# Patient Record
Sex: Male | Born: 1940 | Race: Black or African American | Hispanic: No | Marital: Single | Smoking: Former smoker
Health system: Southern US, Community
[De-identification: ages and names within clinical notes are randomized; demographics above are authoritative.]

## PROBLEM LIST (undated history)

## (undated) DIAGNOSIS — E559 Vitamin D deficiency, unspecified: Secondary | ICD-10-CM

## (undated) DIAGNOSIS — K219 Gastro-esophageal reflux disease without esophagitis: Secondary | ICD-10-CM

## (undated) DIAGNOSIS — M109 Gout, unspecified: Secondary | ICD-10-CM

## (undated) DIAGNOSIS — M25511 Pain in right shoulder: Secondary | ICD-10-CM

## (undated) DIAGNOSIS — I10 Essential (primary) hypertension: Secondary | ICD-10-CM

## (undated) DIAGNOSIS — M25512 Pain in left shoulder: Secondary | ICD-10-CM

## (undated) DIAGNOSIS — D509 Iron deficiency anemia, unspecified: Secondary | ICD-10-CM

## (undated) DIAGNOSIS — K429 Umbilical hernia without obstruction or gangrene: Secondary | ICD-10-CM

## (undated) DIAGNOSIS — I4891 Unspecified atrial fibrillation: Secondary | ICD-10-CM

## (undated) DIAGNOSIS — E119 Type 2 diabetes mellitus without complications: Secondary | ICD-10-CM

## (undated) DIAGNOSIS — E785 Hyperlipidemia, unspecified: Secondary | ICD-10-CM

## (undated) DIAGNOSIS — N189 Chronic kidney disease, unspecified: Secondary | ICD-10-CM

## (undated) DIAGNOSIS — M1990 Unspecified osteoarthritis, unspecified site: Secondary | ICD-10-CM

## (undated) DIAGNOSIS — K449 Diaphragmatic hernia without obstruction or gangrene: Secondary | ICD-10-CM

## (undated) HISTORY — PX: UMBILICAL HERNIA REPAIR: SHX196

## (undated) HISTORY — DX: Diaphragmatic hernia without obstruction or gangrene: K44.9

## (undated) HISTORY — DX: Unspecified osteoarthritis, unspecified site: M19.90

## (undated) HISTORY — DX: Chronic kidney disease, unspecified: N18.9

## (undated) HISTORY — DX: Essential (primary) hypertension: I10

## (undated) HISTORY — DX: Iron deficiency anemia, unspecified: D50.9

## (undated) HISTORY — DX: Hyperlipidemia, unspecified: E78.5

## (undated) HISTORY — DX: Type 2 diabetes mellitus without complications: E11.9

## (undated) HISTORY — DX: Vitamin D deficiency, unspecified: E55.9

## (undated) HISTORY — DX: Pain in left shoulder: M25.512

## (undated) HISTORY — DX: Gout, unspecified: M10.9

## (undated) HISTORY — DX: Pain in right shoulder: M25.511

## (undated) HISTORY — PX: CARPAL TUNNEL RELEASE: SHX101

## (undated) HISTORY — DX: Unspecified atrial fibrillation: I48.91

---

## 1993-02-14 HISTORY — PX: KNEE ARTHROSCOPY WITH PATELLA RECONSTRUCTION: SHX5654

## 1998-02-14 HISTORY — PX: UMBILICAL HERNIA REPAIR: SHX196

## 2006-04-19 DIAGNOSIS — M19079 Primary osteoarthritis, unspecified ankle and foot: Secondary | ICD-10-CM

## 2006-04-19 HISTORY — DX: Primary osteoarthritis, unspecified ankle and foot: M19.079

## 2008-02-15 HISTORY — PX: CARPAL TUNNEL RELEASE: SHX101

## 2012-08-13 DIAGNOSIS — G609 Hereditary and idiopathic neuropathy, unspecified: Secondary | ICD-10-CM

## 2012-08-13 DIAGNOSIS — I10 Essential (primary) hypertension: Secondary | ICD-10-CM | POA: Insufficient documentation

## 2012-08-13 DIAGNOSIS — H269 Unspecified cataract: Secondary | ICD-10-CM

## 2012-08-13 HISTORY — DX: Unspecified cataract: H26.9

## 2012-08-13 HISTORY — DX: Hereditary and idiopathic neuropathy, unspecified: G60.9

## 2012-08-13 HISTORY — DX: Essential (primary) hypertension: I10

## 2012-11-14 DIAGNOSIS — G5600 Carpal tunnel syndrome, unspecified upper limb: Secondary | ICD-10-CM

## 2012-11-14 HISTORY — DX: Carpal tunnel syndrome, unspecified upper limb: G56.00

## 2013-11-15 DIAGNOSIS — N172 Acute kidney failure with medullary necrosis: Secondary | ICD-10-CM

## 2013-11-15 HISTORY — DX: Acute kidney failure with medullary necrosis: N17.2

## 2013-12-19 DIAGNOSIS — R0602 Shortness of breath: Secondary | ICD-10-CM | POA: Insufficient documentation

## 2013-12-19 HISTORY — DX: Shortness of breath: R06.02

## 2014-03-12 DIAGNOSIS — L219 Seborrheic dermatitis, unspecified: Secondary | ICD-10-CM | POA: Diagnosis not present

## 2014-03-12 DIAGNOSIS — L281 Prurigo nodularis: Secondary | ICD-10-CM | POA: Diagnosis not present

## 2014-03-12 DIAGNOSIS — D234 Other benign neoplasm of skin of scalp and neck: Secondary | ICD-10-CM | POA: Diagnosis not present

## 2014-03-20 DIAGNOSIS — E785 Hyperlipidemia, unspecified: Secondary | ICD-10-CM | POA: Diagnosis not present

## 2014-03-20 DIAGNOSIS — E119 Type 2 diabetes mellitus without complications: Secondary | ICD-10-CM | POA: Diagnosis not present

## 2014-03-20 DIAGNOSIS — I10 Essential (primary) hypertension: Secondary | ICD-10-CM | POA: Diagnosis not present

## 2014-03-20 DIAGNOSIS — I480 Paroxysmal atrial fibrillation: Secondary | ICD-10-CM | POA: Diagnosis not present

## 2014-03-27 DIAGNOSIS — M1611 Unilateral primary osteoarthritis, right hip: Secondary | ICD-10-CM | POA: Diagnosis not present

## 2014-04-03 DIAGNOSIS — R350 Frequency of micturition: Secondary | ICD-10-CM | POA: Diagnosis not present

## 2014-04-17 DIAGNOSIS — M25561 Pain in right knee: Secondary | ICD-10-CM | POA: Diagnosis not present

## 2014-05-07 DIAGNOSIS — D234 Other benign neoplasm of skin of scalp and neck: Secondary | ICD-10-CM | POA: Diagnosis not present

## 2014-05-07 DIAGNOSIS — L219 Seborrheic dermatitis, unspecified: Secondary | ICD-10-CM | POA: Diagnosis not present

## 2014-05-07 DIAGNOSIS — L910 Hypertrophic scar: Secondary | ICD-10-CM | POA: Diagnosis not present

## 2014-06-16 DIAGNOSIS — R100 Acute abdomen: Secondary | ICD-10-CM | POA: Diagnosis not present

## 2014-06-24 DIAGNOSIS — I10 Essential (primary) hypertension: Secondary | ICD-10-CM | POA: Diagnosis not present

## 2014-06-24 DIAGNOSIS — I480 Paroxysmal atrial fibrillation: Secondary | ICD-10-CM | POA: Diagnosis not present

## 2014-06-25 DIAGNOSIS — I480 Paroxysmal atrial fibrillation: Secondary | ICD-10-CM | POA: Diagnosis not present

## 2014-06-25 DIAGNOSIS — I10 Essential (primary) hypertension: Secondary | ICD-10-CM | POA: Diagnosis not present

## 2014-06-25 DIAGNOSIS — E119 Type 2 diabetes mellitus without complications: Secondary | ICD-10-CM | POA: Diagnosis not present

## 2014-06-25 DIAGNOSIS — E785 Hyperlipidemia, unspecified: Secondary | ICD-10-CM | POA: Diagnosis not present

## 2014-06-26 DIAGNOSIS — E119 Type 2 diabetes mellitus without complications: Secondary | ICD-10-CM | POA: Diagnosis not present

## 2014-06-26 DIAGNOSIS — E785 Hyperlipidemia, unspecified: Secondary | ICD-10-CM | POA: Diagnosis not present

## 2014-06-26 DIAGNOSIS — I480 Paroxysmal atrial fibrillation: Secondary | ICD-10-CM | POA: Diagnosis not present

## 2014-06-26 DIAGNOSIS — I10 Essential (primary) hypertension: Secondary | ICD-10-CM | POA: Diagnosis not present

## 2014-07-02 DIAGNOSIS — L910 Hypertrophic scar: Secondary | ICD-10-CM | POA: Diagnosis not present

## 2014-07-02 DIAGNOSIS — L219 Seborrheic dermatitis, unspecified: Secondary | ICD-10-CM | POA: Diagnosis not present

## 2014-07-02 DIAGNOSIS — D234 Other benign neoplasm of skin of scalp and neck: Secondary | ICD-10-CM | POA: Diagnosis not present

## 2014-07-04 DIAGNOSIS — R350 Frequency of micturition: Secondary | ICD-10-CM | POA: Diagnosis not present

## 2014-07-04 DIAGNOSIS — N2889 Other specified disorders of kidney and ureter: Secondary | ICD-10-CM | POA: Diagnosis not present

## 2014-07-31 DIAGNOSIS — G5600 Carpal tunnel syndrome, unspecified upper limb: Secondary | ICD-10-CM | POA: Diagnosis not present

## 2014-08-06 DIAGNOSIS — M1990 Unspecified osteoarthritis, unspecified site: Secondary | ICD-10-CM | POA: Diagnosis not present

## 2014-08-06 DIAGNOSIS — I739 Peripheral vascular disease, unspecified: Secondary | ICD-10-CM | POA: Diagnosis not present

## 2014-08-06 DIAGNOSIS — M25579 Pain in unspecified ankle and joints of unspecified foot: Secondary | ICD-10-CM | POA: Diagnosis not present

## 2014-08-06 DIAGNOSIS — I70203 Unspecified atherosclerosis of native arteries of extremities, bilateral legs: Secondary | ICD-10-CM | POA: Diagnosis not present

## 2014-08-19 DIAGNOSIS — G5692 Unspecified mononeuropathy of left upper limb: Secondary | ICD-10-CM | POA: Diagnosis not present

## 2014-09-19 DIAGNOSIS — E785 Hyperlipidemia, unspecified: Secondary | ICD-10-CM | POA: Diagnosis not present

## 2014-09-19 DIAGNOSIS — E119 Type 2 diabetes mellitus without complications: Secondary | ICD-10-CM | POA: Diagnosis not present

## 2014-09-19 DIAGNOSIS — I10 Essential (primary) hypertension: Secondary | ICD-10-CM | POA: Diagnosis not present

## 2014-11-20 ENCOUNTER — Ambulatory Visit (INDEPENDENT_AMBULATORY_CARE_PROVIDER_SITE_OTHER): Payer: Medicare Other | Admitting: Podiatry

## 2014-11-20 ENCOUNTER — Encounter: Payer: Self-pay | Admitting: Podiatry

## 2014-11-20 VITALS — BP 147/94 | HR 53 | Ht 71.0 in | Wt 235.0 lb

## 2014-11-20 DIAGNOSIS — M19079 Primary osteoarthritis, unspecified ankle and foot: Secondary | ICD-10-CM | POA: Insufficient documentation

## 2014-11-20 DIAGNOSIS — L600 Ingrowing nail: Secondary | ICD-10-CM | POA: Insufficient documentation

## 2014-11-20 DIAGNOSIS — M79601 Pain in right arm: Secondary | ICD-10-CM

## 2014-11-20 DIAGNOSIS — M129 Arthropathy, unspecified: Secondary | ICD-10-CM | POA: Diagnosis not present

## 2014-11-20 DIAGNOSIS — M79604 Pain in right leg: Secondary | ICD-10-CM

## 2014-11-20 DIAGNOSIS — M79606 Pain in leg, unspecified: Secondary | ICD-10-CM

## 2014-11-20 DIAGNOSIS — B351 Tinea unguium: Secondary | ICD-10-CM | POA: Diagnosis not present

## 2014-11-20 DIAGNOSIS — M79605 Pain in left leg: Secondary | ICD-10-CM

## 2014-11-20 HISTORY — DX: Ingrowing nail: L60.0

## 2014-11-20 HISTORY — DX: Primary osteoarthritis, unspecified ankle and foot: M19.079

## 2014-11-20 HISTORY — DX: Tinea unguium: B35.1

## 2014-11-20 HISTORY — DX: Pain in right leg: M79.604

## 2014-11-20 HISTORY — DX: Pain in right leg: M79.605

## 2014-11-20 NOTE — Progress Notes (Signed)
SUBJECTIVE: 74 y.o. year old male presents for diabetic foot care.  Diabetic x 30 years.  Blood sugar is under control. 120 last time checked.  Patient is ambulatory without assistance.  Patient goes to [place] and been checked by MD while in [place] 3-4 months ago.  Done with moving 2 weeks ago.   REVIEW OF SYSTEMS: A comprehensive review of systems was negative except for: Diabetes and hypertension.   OBJECTIVE: DERMATOLOGIC EXAMINATION: Nails: Painful ingrown nail on both great toes at medial border.  Thick hypertrophic nails x 10.  Skin Integrity: Normal without abnormal skin lesions.  VASCULAR EXAMINATION OF LOWER LIMBS: Pedal pulses: All pedal pulses are palpable with normal pulsation.  No edema or erythema noted.  NEUROLOGIC EXAMINATION OF THE LOWER LIMBS: All epicritic and tactile sensations grossly intact.  MUSCULOSKELETAL EXAMINATION: Rectus foot with pain in ankle joints bilateral. Positive of normal ankle joint motion. No edema or erythema noted on ankle joints.   ASSESSMENT: Painful ingrown nail both great toes medial borders. Onychomycosis x 10. Ankle arthropathy bilateral. Painful feet.  PLAN: Reviewed clinical findings and available treatment options. May use Tylenol for joint pain.  Both ingrown nails removed and all other nails debrided.  Return in 3 month for routine foot care or for permanent nail surgery if needed.

## 2014-11-20 NOTE — Patient Instructions (Signed)
Seen for painful nails and ankle joints. All nails debrided. May benefit from ingrown nail surgery if problem persist. Take Tylenol as needed for ankle pain.  Return in 3 months or as needed.

## 2015-02-06 DIAGNOSIS — N401 Enlarged prostate with lower urinary tract symptoms: Secondary | ICD-10-CM | POA: Diagnosis not present

## 2015-02-06 DIAGNOSIS — N410 Acute prostatitis: Secondary | ICD-10-CM | POA: Diagnosis not present

## 2015-02-18 ENCOUNTER — Ambulatory Visit (INDEPENDENT_AMBULATORY_CARE_PROVIDER_SITE_OTHER): Payer: Medicare Other | Admitting: Podiatry

## 2015-02-18 ENCOUNTER — Encounter: Payer: Self-pay | Admitting: Podiatry

## 2015-02-18 VITALS — BP 114/53 | HR 77

## 2015-02-18 DIAGNOSIS — M79606 Pain in leg, unspecified: Secondary | ICD-10-CM | POA: Diagnosis not present

## 2015-02-18 DIAGNOSIS — B351 Tinea unguium: Secondary | ICD-10-CM

## 2015-02-18 NOTE — Patient Instructions (Signed)
Seen for hypertrophic nails. All nails debrided. May benefit from diabetic shoes to reduce pain under the balls. Return in 3 months or as needed.

## 2015-02-18 NOTE — Progress Notes (Signed)
SUBJECTIVE: 75 y.o. year old male presents for diabetic foot care. Diabetic x 30 years.  Blood sugar is under control. Patient is ambulatory without assistance. Wearing ankle brace on left ankle for arthritic pain.  Having pain under the ball of right foot.   OBJECTIVE: DERMATOLOGIC EXAMINATION: Thick hypertrophic nails x 10.  Skin Integrity: Normal without abnormal skin lesions.  VASCULAR EXAMINATION OF LOWER LIMBS: Pedal pulses: All pedal pulses are palpable with normal pulsation.  No edema or erythema noted.  NEUROLOGIC EXAMINATION OF THE LOWER LIMBS: All epicritic and tactile sensations grossly intact.  MUSCULOSKELETAL EXAMINATION: Thin plantar fat pad under the 2nd and 3rd MPJ area bilateral. Positive of bilateral HAV with bunion.  ASSESSMENT: Onychomycosis x 10. Ankle arthropathy bilateral. Painful feet, lesser metatarsalgia.   Plan: Debrided all nails. Advised to get a new pair of diabetic shoes with extra cushion. [facility].

## 2015-05-07 DIAGNOSIS — N529 Male erectile dysfunction, unspecified: Secondary | ICD-10-CM | POA: Diagnosis not present

## 2015-05-07 DIAGNOSIS — N401 Enlarged prostate with lower urinary tract symptoms: Secondary | ICD-10-CM | POA: Diagnosis not present

## 2015-05-17 DIAGNOSIS — M109 Gout, unspecified: Secondary | ICD-10-CM | POA: Diagnosis not present

## 2015-10-16 DIAGNOSIS — M1990 Unspecified osteoarthritis, unspecified site: Secondary | ICD-10-CM | POA: Diagnosis not present

## 2015-10-16 DIAGNOSIS — Z7984 Long term (current) use of oral hypoglycemic drugs: Secondary | ICD-10-CM | POA: Diagnosis not present

## 2015-10-16 DIAGNOSIS — M779 Enthesopathy, unspecified: Secondary | ICD-10-CM | POA: Diagnosis not present

## 2015-10-16 DIAGNOSIS — I10 Essential (primary) hypertension: Secondary | ICD-10-CM | POA: Diagnosis not present

## 2015-10-16 DIAGNOSIS — E119 Type 2 diabetes mellitus without complications: Secondary | ICD-10-CM | POA: Diagnosis not present

## 2015-10-16 DIAGNOSIS — M1711 Unilateral primary osteoarthritis, right knee: Secondary | ICD-10-CM | POA: Diagnosis not present

## 2015-10-16 DIAGNOSIS — M25561 Pain in right knee: Secondary | ICD-10-CM | POA: Diagnosis not present

## 2015-11-10 DIAGNOSIS — N538 Other male sexual dysfunction: Secondary | ICD-10-CM | POA: Diagnosis not present

## 2015-11-10 DIAGNOSIS — N3000 Acute cystitis without hematuria: Secondary | ICD-10-CM | POA: Diagnosis not present

## 2015-11-10 DIAGNOSIS — R351 Nocturia: Secondary | ICD-10-CM | POA: Diagnosis not present

## 2015-11-10 DIAGNOSIS — N401 Enlarged prostate with lower urinary tract symptoms: Secondary | ICD-10-CM | POA: Diagnosis not present

## 2015-11-19 DIAGNOSIS — H9201 Otalgia, right ear: Secondary | ICD-10-CM | POA: Diagnosis not present

## 2015-11-19 DIAGNOSIS — T161XXA Foreign body in right ear, initial encounter: Secondary | ICD-10-CM | POA: Diagnosis not present

## 2015-12-21 DIAGNOSIS — I10 Essential (primary) hypertension: Secondary | ICD-10-CM | POA: Diagnosis not present

## 2015-12-21 DIAGNOSIS — H6591 Unspecified nonsuppurative otitis media, right ear: Secondary | ICD-10-CM | POA: Diagnosis not present

## 2015-12-26 DIAGNOSIS — N289 Disorder of kidney and ureter, unspecified: Secondary | ICD-10-CM | POA: Diagnosis not present

## 2015-12-26 DIAGNOSIS — R51 Headache: Secondary | ICD-10-CM | POA: Diagnosis not present

## 2015-12-26 DIAGNOSIS — R1012 Left upper quadrant pain: Secondary | ICD-10-CM | POA: Diagnosis not present

## 2015-12-26 DIAGNOSIS — S20212A Contusion of left front wall of thorax, initial encounter: Secondary | ICD-10-CM | POA: Diagnosis not present

## 2015-12-26 DIAGNOSIS — S299XXA Unspecified injury of thorax, initial encounter: Secondary | ICD-10-CM | POA: Diagnosis not present

## 2015-12-26 DIAGNOSIS — R0781 Pleurodynia: Secondary | ICD-10-CM | POA: Diagnosis not present

## 2015-12-26 DIAGNOSIS — S0990XA Unspecified injury of head, initial encounter: Secondary | ICD-10-CM | POA: Diagnosis not present

## 2015-12-26 DIAGNOSIS — S3991XA Unspecified injury of abdomen, initial encounter: Secondary | ICD-10-CM | POA: Diagnosis not present

## 2016-01-14 DIAGNOSIS — M79672 Pain in left foot: Secondary | ICD-10-CM | POA: Diagnosis not present

## 2016-01-14 DIAGNOSIS — M1711 Unilateral primary osteoarthritis, right knee: Secondary | ICD-10-CM | POA: Diagnosis not present

## 2016-01-14 DIAGNOSIS — M1712 Unilateral primary osteoarthritis, left knee: Secondary | ICD-10-CM | POA: Diagnosis not present

## 2016-01-18 DIAGNOSIS — H6591 Unspecified nonsuppurative otitis media, right ear: Secondary | ICD-10-CM | POA: Diagnosis not present

## 2016-02-15 HISTORY — PX: FOOT FUSION: SHX956

## 2016-02-18 DIAGNOSIS — M25561 Pain in right knee: Secondary | ICD-10-CM | POA: Diagnosis not present

## 2016-02-18 DIAGNOSIS — M1712 Unilateral primary osteoarthritis, left knee: Secondary | ICD-10-CM | POA: Diagnosis not present

## 2016-02-19 DIAGNOSIS — Z043 Encounter for examination and observation following other accident: Secondary | ICD-10-CM | POA: Diagnosis not present

## 2016-02-19 DIAGNOSIS — S0990XA Unspecified injury of head, initial encounter: Secondary | ICD-10-CM | POA: Diagnosis not present

## 2016-02-19 DIAGNOSIS — Z7901 Long term (current) use of anticoagulants: Secondary | ICD-10-CM | POA: Diagnosis not present

## 2016-02-19 DIAGNOSIS — I672 Cerebral atherosclerosis: Secondary | ICD-10-CM | POA: Diagnosis not present

## 2016-02-24 DIAGNOSIS — M19072 Primary osteoarthritis, left ankle and foot: Secondary | ICD-10-CM | POA: Diagnosis not present

## 2016-04-01 DIAGNOSIS — L209 Atopic dermatitis, unspecified: Secondary | ICD-10-CM | POA: Diagnosis not present

## 2016-04-01 DIAGNOSIS — L810 Postinflammatory hyperpigmentation: Secondary | ICD-10-CM | POA: Diagnosis not present

## 2016-04-01 DIAGNOSIS — D485 Neoplasm of uncertain behavior of skin: Secondary | ICD-10-CM | POA: Diagnosis not present

## 2016-04-06 DIAGNOSIS — M23221 Derangement of posterior horn of medial meniscus due to old tear or injury, right knee: Secondary | ICD-10-CM | POA: Diagnosis not present

## 2016-04-06 DIAGNOSIS — M6751 Plica syndrome, right knee: Secondary | ICD-10-CM | POA: Diagnosis not present

## 2016-04-06 DIAGNOSIS — M23261 Derangement of other lateral meniscus due to old tear or injury, right knee: Secondary | ICD-10-CM | POA: Diagnosis not present

## 2016-04-06 DIAGNOSIS — G8918 Other acute postprocedural pain: Secondary | ICD-10-CM | POA: Diagnosis not present

## 2016-04-06 DIAGNOSIS — M94261 Chondromalacia, right knee: Secondary | ICD-10-CM | POA: Diagnosis not present

## 2016-04-11 DIAGNOSIS — M1712 Unilateral primary osteoarthritis, left knee: Secondary | ICD-10-CM | POA: Diagnosis not present

## 2016-04-14 DIAGNOSIS — R262 Difficulty in walking, not elsewhere classified: Secondary | ICD-10-CM | POA: Diagnosis not present

## 2016-04-14 DIAGNOSIS — M25561 Pain in right knee: Secondary | ICD-10-CM | POA: Diagnosis not present

## 2016-04-14 DIAGNOSIS — M23221 Derangement of posterior horn of medial meniscus due to old tear or injury, right knee: Secondary | ICD-10-CM | POA: Diagnosis not present

## 2016-04-25 DIAGNOSIS — Z9889 Other specified postprocedural states: Secondary | ICD-10-CM | POA: Diagnosis not present

## 2016-04-26 DIAGNOSIS — R232 Flushing: Secondary | ICD-10-CM | POA: Diagnosis not present

## 2016-04-26 DIAGNOSIS — M25561 Pain in right knee: Secondary | ICD-10-CM | POA: Diagnosis not present

## 2016-04-26 DIAGNOSIS — M23221 Derangement of posterior horn of medial meniscus due to old tear or injury, right knee: Secondary | ICD-10-CM | POA: Diagnosis not present

## 2016-04-28 DIAGNOSIS — R262 Difficulty in walking, not elsewhere classified: Secondary | ICD-10-CM | POA: Diagnosis not present

## 2016-04-28 DIAGNOSIS — M25561 Pain in right knee: Secondary | ICD-10-CM | POA: Diagnosis not present

## 2016-04-28 DIAGNOSIS — M23221 Derangement of posterior horn of medial meniscus due to old tear or injury, right knee: Secondary | ICD-10-CM | POA: Diagnosis not present

## 2016-05-03 DIAGNOSIS — R262 Difficulty in walking, not elsewhere classified: Secondary | ICD-10-CM | POA: Diagnosis not present

## 2016-05-03 DIAGNOSIS — M23221 Derangement of posterior horn of medial meniscus due to old tear or injury, right knee: Secondary | ICD-10-CM | POA: Diagnosis not present

## 2016-05-03 DIAGNOSIS — M25561 Pain in right knee: Secondary | ICD-10-CM | POA: Diagnosis not present

## 2016-05-05 DIAGNOSIS — M25561 Pain in right knee: Secondary | ICD-10-CM | POA: Diagnosis not present

## 2016-05-05 DIAGNOSIS — M23221 Derangement of posterior horn of medial meniscus due to old tear or injury, right knee: Secondary | ICD-10-CM | POA: Diagnosis not present

## 2016-05-05 DIAGNOSIS — R262 Difficulty in walking, not elsewhere classified: Secondary | ICD-10-CM | POA: Diagnosis not present

## 2016-05-10 DIAGNOSIS — M25561 Pain in right knee: Secondary | ICD-10-CM | POA: Diagnosis not present

## 2016-05-10 DIAGNOSIS — M23221 Derangement of posterior horn of medial meniscus due to old tear or injury, right knee: Secondary | ICD-10-CM | POA: Diagnosis not present

## 2016-05-10 DIAGNOSIS — R262 Difficulty in walking, not elsewhere classified: Secondary | ICD-10-CM | POA: Diagnosis not present

## 2016-05-12 DIAGNOSIS — R262 Difficulty in walking, not elsewhere classified: Secondary | ICD-10-CM | POA: Diagnosis not present

## 2016-05-12 DIAGNOSIS — M23221 Derangement of posterior horn of medial meniscus due to old tear or injury, right knee: Secondary | ICD-10-CM | POA: Diagnosis not present

## 2016-05-12 DIAGNOSIS — M25561 Pain in right knee: Secondary | ICD-10-CM | POA: Diagnosis not present

## 2016-05-17 DIAGNOSIS — Z9889 Other specified postprocedural states: Secondary | ICD-10-CM | POA: Diagnosis not present

## 2016-07-29 DIAGNOSIS — N401 Enlarged prostate with lower urinary tract symptoms: Secondary | ICD-10-CM | POA: Diagnosis not present

## 2016-07-29 DIAGNOSIS — N529 Male erectile dysfunction, unspecified: Secondary | ICD-10-CM | POA: Diagnosis not present

## 2016-07-29 DIAGNOSIS — L309 Dermatitis, unspecified: Secondary | ICD-10-CM | POA: Diagnosis not present

## 2016-08-22 DIAGNOSIS — Z0000 Encounter for general adult medical examination without abnormal findings: Secondary | ICD-10-CM | POA: Diagnosis not present

## 2017-02-02 DIAGNOSIS — J3489 Other specified disorders of nose and nasal sinuses: Secondary | ICD-10-CM | POA: Diagnosis not present

## 2017-02-02 DIAGNOSIS — T162XXA Foreign body in left ear, initial encounter: Secondary | ICD-10-CM | POA: Diagnosis not present

## 2017-02-02 DIAGNOSIS — T161XXA Foreign body in right ear, initial encounter: Secondary | ICD-10-CM | POA: Diagnosis not present

## 2017-02-02 DIAGNOSIS — R682 Dry mouth, unspecified: Secondary | ICD-10-CM | POA: Diagnosis not present

## 2017-02-17 DIAGNOSIS — J3489 Other specified disorders of nose and nasal sinuses: Secondary | ICD-10-CM | POA: Diagnosis not present

## 2017-02-17 DIAGNOSIS — H938X9 Other specified disorders of ear, unspecified ear: Secondary | ICD-10-CM | POA: Diagnosis not present

## 2017-06-20 DIAGNOSIS — M25572 Pain in left ankle and joints of left foot: Secondary | ICD-10-CM | POA: Diagnosis not present

## 2017-06-20 DIAGNOSIS — M19072 Primary osteoarthritis, left ankle and foot: Secondary | ICD-10-CM | POA: Diagnosis not present

## 2017-06-20 DIAGNOSIS — M25562 Pain in left knee: Secondary | ICD-10-CM | POA: Diagnosis not present

## 2017-06-20 DIAGNOSIS — M170 Bilateral primary osteoarthritis of knee: Secondary | ICD-10-CM | POA: Diagnosis not present

## 2017-06-20 DIAGNOSIS — M25512 Pain in left shoulder: Secondary | ICD-10-CM | POA: Diagnosis not present

## 2017-06-20 DIAGNOSIS — M25561 Pain in right knee: Secondary | ICD-10-CM | POA: Diagnosis not present

## 2017-06-20 DIAGNOSIS — M75102 Unspecified rotator cuff tear or rupture of left shoulder, not specified as traumatic: Secondary | ICD-10-CM | POA: Diagnosis not present

## 2017-06-20 DIAGNOSIS — G8929 Other chronic pain: Secondary | ICD-10-CM | POA: Diagnosis not present

## 2017-06-26 DIAGNOSIS — M2142 Flat foot [pes planus] (acquired), left foot: Secondary | ICD-10-CM | POA: Diagnosis not present

## 2017-06-26 DIAGNOSIS — M11272 Other chondrocalcinosis, left ankle and foot: Secondary | ICD-10-CM | POA: Diagnosis not present

## 2017-06-26 DIAGNOSIS — Z888 Allergy status to other drugs, medicaments and biological substances status: Secondary | ICD-10-CM | POA: Diagnosis not present

## 2017-06-26 DIAGNOSIS — Z880 Allergy status to penicillin: Secondary | ICD-10-CM | POA: Diagnosis not present

## 2017-06-26 DIAGNOSIS — M76822 Posterior tibial tendinitis, left leg: Secondary | ICD-10-CM

## 2017-06-26 DIAGNOSIS — M19072 Primary osteoarthritis, left ankle and foot: Secondary | ICD-10-CM

## 2017-06-26 DIAGNOSIS — M7732 Calcaneal spur, left foot: Secondary | ICD-10-CM | POA: Diagnosis not present

## 2017-06-26 HISTORY — DX: Primary osteoarthritis, left ankle and foot: M19.072

## 2017-06-26 HISTORY — DX: Posterior tibial tendinitis, left leg: M76.822

## 2017-06-28 DIAGNOSIS — K219 Gastro-esophageal reflux disease without esophagitis: Secondary | ICD-10-CM

## 2017-06-28 DIAGNOSIS — I10 Essential (primary) hypertension: Secondary | ICD-10-CM

## 2017-06-28 DIAGNOSIS — E119 Type 2 diabetes mellitus without complications: Secondary | ICD-10-CM

## 2017-06-28 DIAGNOSIS — G4733 Obstructive sleep apnea (adult) (pediatric): Secondary | ICD-10-CM | POA: Insufficient documentation

## 2017-06-28 DIAGNOSIS — M109 Gout, unspecified: Secondary | ICD-10-CM | POA: Insufficient documentation

## 2017-06-28 DIAGNOSIS — N183 Chronic kidney disease, stage 3 unspecified: Secondary | ICD-10-CM

## 2017-06-28 DIAGNOSIS — I480 Paroxysmal atrial fibrillation: Secondary | ICD-10-CM

## 2017-06-28 DIAGNOSIS — N400 Enlarged prostate without lower urinary tract symptoms: Secondary | ICD-10-CM

## 2017-06-28 DIAGNOSIS — E785 Hyperlipidemia, unspecified: Secondary | ICD-10-CM | POA: Insufficient documentation

## 2017-06-28 HISTORY — DX: Chronic kidney disease, stage 3 unspecified: N18.30

## 2017-06-28 HISTORY — DX: Gastro-esophageal reflux disease without esophagitis: K21.9

## 2017-06-28 HISTORY — DX: Type 2 diabetes mellitus without complications: E11.9

## 2017-06-28 HISTORY — DX: Paroxysmal atrial fibrillation: I48.0

## 2017-06-28 HISTORY — DX: Benign prostatic hyperplasia without lower urinary tract symptoms: N40.0

## 2017-06-28 HISTORY — DX: Obstructive sleep apnea (adult) (pediatric): G47.33

## 2017-06-28 HISTORY — DX: Essential (primary) hypertension: I10

## 2017-06-30 DIAGNOSIS — G4733 Obstructive sleep apnea (adult) (pediatric): Secondary | ICD-10-CM | POA: Diagnosis not present

## 2017-06-30 DIAGNOSIS — E785 Hyperlipidemia, unspecified: Secondary | ICD-10-CM | POA: Diagnosis not present

## 2017-06-30 DIAGNOSIS — G8918 Other acute postprocedural pain: Secondary | ICD-10-CM | POA: Diagnosis not present

## 2017-06-30 DIAGNOSIS — K219 Gastro-esophageal reflux disease without esophagitis: Secondary | ICD-10-CM | POA: Diagnosis not present

## 2017-06-30 DIAGNOSIS — N400 Enlarged prostate without lower urinary tract symptoms: Secondary | ICD-10-CM | POA: Diagnosis not present

## 2017-06-30 DIAGNOSIS — Z9119 Patient's noncompliance with other medical treatment and regimen: Secondary | ICD-10-CM | POA: Diagnosis not present

## 2017-06-30 DIAGNOSIS — I129 Hypertensive chronic kidney disease with stage 1 through stage 4 chronic kidney disease, or unspecified chronic kidney disease: Secondary | ICD-10-CM | POA: Diagnosis not present

## 2017-06-30 DIAGNOSIS — M76822 Posterior tibial tendinitis, left leg: Secondary | ICD-10-CM | POA: Diagnosis not present

## 2017-06-30 DIAGNOSIS — Z9989 Dependence on other enabling machines and devices: Secondary | ICD-10-CM | POA: Diagnosis not present

## 2017-06-30 DIAGNOSIS — M109 Gout, unspecified: Secondary | ICD-10-CM | POA: Diagnosis not present

## 2017-06-30 DIAGNOSIS — E1122 Type 2 diabetes mellitus with diabetic chronic kidney disease: Secondary | ICD-10-CM | POA: Diagnosis not present

## 2017-06-30 DIAGNOSIS — N183 Chronic kidney disease, stage 3 (moderate): Secondary | ICD-10-CM | POA: Diagnosis not present

## 2017-06-30 DIAGNOSIS — Z79899 Other long term (current) drug therapy: Secondary | ICD-10-CM | POA: Diagnosis not present

## 2017-06-30 DIAGNOSIS — M19072 Primary osteoarthritis, left ankle and foot: Secondary | ICD-10-CM | POA: Diagnosis not present

## 2017-06-30 DIAGNOSIS — Z794 Long term (current) use of insulin: Secondary | ICD-10-CM | POA: Diagnosis not present

## 2017-06-30 DIAGNOSIS — Z981 Arthrodesis status: Secondary | ICD-10-CM | POA: Diagnosis not present

## 2017-07-19 DIAGNOSIS — Z4789 Encounter for other orthopedic aftercare: Secondary | ICD-10-CM | POA: Diagnosis not present

## 2017-07-19 DIAGNOSIS — Z09 Encounter for follow-up examination after completed treatment for conditions other than malignant neoplasm: Secondary | ICD-10-CM | POA: Diagnosis not present

## 2017-07-19 DIAGNOSIS — Z9889 Other specified postprocedural states: Secondary | ICD-10-CM | POA: Diagnosis not present

## 2017-08-15 DIAGNOSIS — Z9889 Other specified postprocedural states: Secondary | ICD-10-CM | POA: Diagnosis not present

## 2017-08-15 DIAGNOSIS — Z981 Arthrodesis status: Secondary | ICD-10-CM | POA: Diagnosis not present

## 2017-08-15 DIAGNOSIS — M19072 Primary osteoarthritis, left ankle and foot: Secondary | ICD-10-CM | POA: Diagnosis not present

## 2017-08-15 DIAGNOSIS — Z4789 Encounter for other orthopedic aftercare: Secondary | ICD-10-CM | POA: Diagnosis not present

## 2017-08-15 DIAGNOSIS — M76822 Posterior tibial tendinitis, left leg: Secondary | ICD-10-CM | POA: Diagnosis not present

## 2017-09-25 DIAGNOSIS — Z9889 Other specified postprocedural states: Secondary | ICD-10-CM | POA: Diagnosis not present

## 2017-09-25 DIAGNOSIS — M76822 Posterior tibial tendinitis, left leg: Secondary | ICD-10-CM | POA: Diagnosis not present

## 2017-09-25 DIAGNOSIS — Z981 Arthrodesis status: Secondary | ICD-10-CM | POA: Diagnosis not present

## 2017-09-25 DIAGNOSIS — M19072 Primary osteoarthritis, left ankle and foot: Secondary | ICD-10-CM | POA: Diagnosis not present

## 2018-01-01 ENCOUNTER — Ambulatory Visit (INDEPENDENT_AMBULATORY_CARE_PROVIDER_SITE_OTHER): Payer: Medicare Other | Admitting: Neurology

## 2018-01-01 ENCOUNTER — Encounter: Payer: Self-pay | Admitting: Neurology

## 2018-01-01 VITALS — BP 139/71 | HR 66 | Ht 71.0 in | Wt 232.0 lb

## 2018-01-01 DIAGNOSIS — R251 Tremor, unspecified: Secondary | ICD-10-CM | POA: Diagnosis not present

## 2018-01-01 NOTE — Patient Instructions (Signed)
You have a rather mild and intermittent tremor of both hands, but I do not see any signs or symptoms of parkinson's like disease or what we call parkinsonism.   For your tremor, I would not recommend any new medication for fear of side effects (especially sleepiness) or medication interactions, especially in light of you taking multiple medications and the tremor is rather mild.   We do not have to make a follow up appointment. I would be happy to see you back as needed.   Please remember, that any kind of tremor may be exacerbated by anxiety, anger, nervousness, excitement, dehydration, sleep deprivation, by caffeine, and low blood sugar values or blood sugar fluctuations. Some medications can exacerbate tremors.   Please be consistent with your CPAP, get in touch with the [place] provider to help you with mouth dryness.

## 2018-01-01 NOTE — Progress Notes (Signed)
Subjective:    Patient ID: Aldin Drees Sr. is a 77 y.o. male.  HPI     Star Age, MD, PhD [facility] [address], [address] [place], [place] [address]  Dear Dr. Rondell Reams,   I saw your patient, Kamaron Deskins, upon your kind request, in my neurologic clinic today for initial consultation of his tremors. The patient is unaccompanied today. As you know, Mr. Willis is a 77 year old right-handed gentleman with an underlying medical history of arthritis with status post right knee arthroscopic surgery, status post recent left ankle surgery in May 2019, status post carpal tunnel surgery, bilateral shoulder pain with history of biceps tendon tear on the right several years ago, history of gout, hiatal hernia, hypertension, diabetes, A. fib, iron deficiency anemia, vitamin D deficiency, hyperlipidemia, BPH and obesity, who reports a hand tremor for the past over 1-1/2 years. He noticed right hand tremor primarily with his handwriting approximately a year after his right carpal tunnel surgery. He had surgery in February 2017 for this. He does notice much in the way of tremor with his day-to-day activities, no resting tremor reported, no significant progression but it didn't bother him that he has trembling when writing.   I reviewed your office note from 10/11/2017, which you kindly included. Of note, he is on multiple medications, which are listed below, and he is currently on metoprolol 25 mg once daily. He is also on gabapentin 300 mg 3 times a day. He lives alone, has no children, quit smoking a long time ago, in 1971, drinks alcohol occasionally, maybe once every 2 weeks and caffeine occasionally, tries to drink decaf at home. He tries to hydrate well with water. Of note, he does not sleep very well and admits that he does not use his CPAP on a regular basis. He was diagnosed with obstructive sleep apnea. He has a new CPAP machine but has had issues with mouth dryness.    His Past Medical History Is Significant For: Past Medical History:  Diagnosis Date  . A-fib ([place])   . Bilateral shoulder pain   . Chronic kidney disease   . Diabetes mellitus without complication ([place])   . Gout   . Hiatal hernia   . HLD (hyperlipidemia)   . Hypertension   . Iron deficiency anemia   . OA (osteoarthritis)   . Vitamin D deficiency      His Past Surgical History Is Significant For: Multiple surgeries, joint related mostly.  His Family History Is Significant For: No family history on file.  His Social History Is Significant For: Social History   Socioeconomic History  . Marital status: Single    Spouse name: Not on file  . Number of children: Not on file  . Years of education: Not on file  . Highest education level: Not on file  Occupational History  . Not on file  Social Needs  . Financial resource strain: Not on file  . Food insecurity:    Worry: Not on file    Inability: Not on file  . Transportation needs:    Medical: Not on file    Non-medical: Not on file  Tobacco Use  . Smoking status: Former Smoker    Packs/day: 1.00    Types: Cigarettes    Last attempt to quit: 08/19/1969    Years since quitting: 48.4  . Smokeless tobacco: Never Used  Substance and Sexual Activity  . Alcohol use: Yes    Alcohol/week: 1.0 standard drinks  Types: 1 Standard drinks or equivalent per week  . Drug use: No  . Sexual activity: Not on file  Lifestyle  . Physical activity:    Days per week: Not on file    Minutes per session: Not on file  . Stress: Not on file  Relationships  . Social connections:    Talks on phone: Not on file    Gets together: Not on file    Attends religious service: Not on file    Active member of club or organization: Not on file    Attends meetings of clubs or organizations: Not on file    Relationship status: Not on file  Other Topics Concern  . Not on file  Social History Narrative  . Not on file    His Allergies Are:   Allergies  Allergen Reactions  . Penicillin G Nausea And Vomiting  . Pravastatin Nausea And Vomiting  . Niacin Rash  :   His Current Medications Are:  Outpatient Encounter Medications as of 01/01/2018  Medication Sig  . allopurinol (ZYLOPRIM) 100 MG tablet Take 100 mg by mouth daily.  Marland Kitchen amLODipine (NORVASC) 10 MG tablet Take 10 mg by mouth daily.  Marland Kitchen Apixaban (ELIQUIS PO) Take 5 mg by mouth 2 (two) times daily.  . Cholecalciferol (VITAMIN D3 PO) Take 1,000 Units by mouth.  . colchicine 0.6 MG tablet Take 0.6 mg by mouth as needed.  . ferrous sulfate 325 (65 FE) MG tablet Take 325 mg by mouth daily with breakfast.  . finasteride (PROSCAR) 5 MG tablet Take 5 mg by mouth daily.  . furosemide (LASIX) 20 MG tablet Take 20 mg by mouth.  . gabapentin (NEURONTIN) 300 MG capsule Take 300 mg by mouth 3 (three) times daily.  Marland Kitchen glipiZIDE (GLUCOTROL) 5 MG tablet Take by mouth daily before breakfast.  . hydrALAZINE (APRESOLINE) 50 MG tablet Take 50 mg by mouth 2 (two) times daily.  . hydrochlorothiazide (HYDRODIURIL) 25 MG tablet Take 25 mg by mouth daily.  . insulin glargine (LANTUS) 100 UNIT/ML injection Inject 18 Units into the skin at bedtime.   Marland Kitchen lisinopril (PRINIVIL,ZESTRIL) 40 MG tablet Take 40 mg by mouth daily.  . metFORMIN (GLUCOPHAGE) 500 MG tablet Take 500-1,000 mg by mouth 2 (two) times daily with a meal.   . metoprolol tartrate (LOPRESSOR) 50 MG tablet Take 25 mg by mouth 2 (two) times daily.  Marland Kitchen omeprazole (PRILOSEC) 20 MG capsule Take 20 mg by mouth daily.  . rosuvastatin (CRESTOR) 40 MG tablet Take 20 mg by mouth daily.  . tamsulosin (FLOMAX) 0.4 MG CAPS capsule Take 0.4 mg by mouth daily.  . traMADol (ULTRAM) 50 MG tablet Take 50-100 mg by mouth 3 (three) times daily.  . vitamin B-12 (CYANOCOBALAMIN) 500 MCG tablet Take 250 mcg by mouth daily.  . [DISCONTINUED] warfarin (COUMADIN) 5 MG tablet Take 5 mg by mouth daily.   No facility-administered encounter medications on file as of  01/01/2018.   : Review of Systems:  Out of a complete 14 point review of systems, all are reviewed and negative with the exception of these symptoms as listed below: Review of Systems  Neurological:       Pt presents today to discuss his right handed tremor. Pt is right handed and notices the tremor when he tries to write.    Objective:  Neurological Exam  Physical Exam Physical Examination:   Vitals:   01/01/18 0947  BP: 139/71  Pulse: 66    General Examination: The  patient is a very pleasant 77 y.o. male in no acute distress. He appears well-developed and well-nourished and well groomed.   HEENT: Normocephalic, atraumatic, pupils are equal, round and reactive to light and accommodation. Funduscopic exam is difficult, bilateral cataracts are noted. He is wearing corrective eyeglasses. Extraocular tracking is good without limitation to gaze excursion or nystagmus noted. Normal smooth pursuit is noted. Hearing is grossly intact. Face is symmetric with normal facial animation and normal facial sensation. Speech is clear with no dysarthria noted. There is no hypophonia. There is no lip, neck/head, jaw or voice tremor. Neck is supple with full range of passive and active motion. Oropharynx exam reveals: mild mouth dryness, adequate dental hygiene with dentures on top and multiple missing teeth on the bottom. Tongue protrudes centrally and palate elevates symmetrically.  Chest: Clear to auscultation without wheezing, rhonchi or crackles noted.  Heart: S1+S2+0, Irregularly irregular, no murmurs noted.   Abdomen: Soft, non-tender and non-distended with normal bowel sounds appreciated on auscultation.  Extremities: There is no pitting edema in the distal lower extremities bilaterally, except for mild puffiness noted around left ankle, left ankle wider than right, status post recent surgery and hardware in place.  Skin: Warm and dry without trophic changes noted.  Musculoskeletal: exam  reveals:limited range of motion in the left ankle, decreased range of motion in the left shoulder, decreased range of motion in the left knee. Reports low back pain.  Neurologically:  Mental status: The patient is awake, alert and oriented in all 4 spheres. His immediate and remote memory, attention, language skills and fund of knowledge are appropriate. There is no evidence of aphasia, agnosia, apraxia or anomia. Speech is clear with normal prosody and enunciation. Thought process is linear. Mood is normal and affect is normal.  Cranial nerves II - XII are as described above under HEENT exam. In addition: shoulder shrug is normal with equal shoulder height noted. Motor exam: Normal bulk, strength and tone is noted. There is no drift, resting tremor or rebound.  On 01/01/2018: on Archimedes spiral drawing he has minimal insecurity with the right hand, slight insecurity with the left hand and minimal trembling noted with the left more than right on tracking. Handwriting with the right hand is slightly tremulous, slightly difficult to read (cursive, easier to read in print), not micrographic.  No obvious postural tremor but has minimal action tremor in both upper extremities. Romberg is negative. Reflexes are 1+ in the upper extremities and absent in the lower extremities. Fine motor skills are globally mildly impaired, limitation in range of motion in the left ankle noted. Cerebellar testing: No dysmetria or intention tremor on finger to nose testing. There is no truncal or gait ataxia.  Sensory exam: intact to light touch in the upper and lower extremities.  Gait, station and balance: He stands with difficulty, walks with a limp on the L.                Assessment and Plan:   In summary, Andrej Spagnoli. is a very pleasant 77 y.o.-year old male with an underlying medical history of arthritis with status post right knee arthroscopic surgery, status post recent left ankle surgery in May 2019, status  post carpal tunnel surgery, bilateral shoulder pain with history of biceps tendon tear on the right several years ago, history of gout, hiatal hernia, hypertension, diabetes, A. fib, iron deficiency anemia, vitamin D deficiency, hyperlipidemia, BPH and obesity, who presents for evaluation of his hand tremors. On examination,  he has a minimal hand tremor in both upper extremities with action, and trembling noted with his handwriting with the right hand which is his dominant hand. Otherwise, he has no telltale tremor, no classic history for essential tremor and certainly no parkinsonism on examination. He is reassured in that regard. I would not favor symptomatic medication for tremor control in his case. I suggested observation. He is advised that certain situations and triggers can make tremors worse including excess caffeine, dehydration, stress, anxiety, and sleep deprivation. Of note, he admits that he does not sleep very well and does not always use his CPAP. He is strongly encouraged to use his CPAP on a regular basis and talked to the [place] provider regarding his sleep issues and his CPAP machine issues as he also reports mouth dryness. He is advised that there is a connection between underlying sleep apnea and A. Fib. From my end of things I suggested as needed follow-up. He does report that he had imaging testing in the past including CT and/or brain MRI before through with the [place] in [place]. I could not review brain scan results through his chart. Nevertheless, his examination does not suggest any other acute neurologic findings.  I answered all his questions today and the patient was in agreement.  Thank you very much for allowing me to participate in the care of this nice patient. If I can be of any further assistance to you please do not hesitate to call me at [phone].  Sincerely,   Star Age, MD, PhD

## 2018-05-13 DIAGNOSIS — K432 Incisional hernia without obstruction or gangrene: Secondary | ICD-10-CM | POA: Diagnosis not present

## 2018-05-13 DIAGNOSIS — I444 Left anterior fascicular block: Secondary | ICD-10-CM | POA: Diagnosis not present

## 2018-05-13 DIAGNOSIS — R1033 Periumbilical pain: Secondary | ICD-10-CM | POA: Diagnosis not present

## 2018-05-13 DIAGNOSIS — R1031 Right lower quadrant pain: Secondary | ICD-10-CM | POA: Diagnosis not present

## 2018-05-13 DIAGNOSIS — K429 Umbilical hernia without obstruction or gangrene: Secondary | ICD-10-CM | POA: Diagnosis not present

## 2018-05-21 DIAGNOSIS — M6281 Muscle weakness (generalized): Secondary | ICD-10-CM | POA: Diagnosis not present

## 2018-05-21 DIAGNOSIS — R2689 Other abnormalities of gait and mobility: Secondary | ICD-10-CM | POA: Diagnosis not present

## 2018-05-21 DIAGNOSIS — M79605 Pain in left leg: Secondary | ICD-10-CM | POA: Diagnosis not present

## 2018-07-10 DIAGNOSIS — M898X7 Other specified disorders of bone, ankle and foot: Secondary | ICD-10-CM | POA: Diagnosis not present

## 2018-07-10 DIAGNOSIS — M2142 Flat foot [pes planus] (acquired), left foot: Secondary | ICD-10-CM | POA: Diagnosis not present

## 2018-07-10 DIAGNOSIS — M79672 Pain in left foot: Secondary | ICD-10-CM | POA: Diagnosis not present

## 2018-07-10 DIAGNOSIS — M19072 Primary osteoarthritis, left ankle and foot: Secondary | ICD-10-CM | POA: Diagnosis not present

## 2018-07-10 DIAGNOSIS — M76822 Posterior tibial tendinitis, left leg: Secondary | ICD-10-CM | POA: Diagnosis not present

## 2018-07-10 DIAGNOSIS — M7752 Other enthesopathy of left foot: Secondary | ICD-10-CM | POA: Diagnosis not present

## 2018-07-10 DIAGNOSIS — M722 Plantar fascial fibromatosis: Secondary | ICD-10-CM | POA: Diagnosis not present

## 2018-10-24 ENCOUNTER — Ambulatory Visit (INDEPENDENT_AMBULATORY_CARE_PROVIDER_SITE_OTHER): Payer: Medicare Other

## 2018-10-24 ENCOUNTER — Ambulatory Visit (INDEPENDENT_AMBULATORY_CARE_PROVIDER_SITE_OTHER): Payer: Medicare Other | Admitting: Sports Medicine

## 2018-10-24 ENCOUNTER — Other Ambulatory Visit: Payer: Self-pay

## 2018-10-24 ENCOUNTER — Other Ambulatory Visit: Payer: Self-pay | Admitting: Sports Medicine

## 2018-10-24 ENCOUNTER — Encounter: Payer: Self-pay | Admitting: Sports Medicine

## 2018-10-24 DIAGNOSIS — E1142 Type 2 diabetes mellitus with diabetic polyneuropathy: Secondary | ICD-10-CM | POA: Diagnosis not present

## 2018-10-24 DIAGNOSIS — M2040 Other hammer toe(s) (acquired), unspecified foot: Secondary | ICD-10-CM | POA: Diagnosis not present

## 2018-10-24 DIAGNOSIS — M1990 Unspecified osteoarthritis, unspecified site: Secondary | ICD-10-CM | POA: Diagnosis not present

## 2018-10-24 DIAGNOSIS — M79671 Pain in right foot: Secondary | ICD-10-CM

## 2018-10-24 DIAGNOSIS — M79672 Pain in left foot: Secondary | ICD-10-CM

## 2018-10-24 DIAGNOSIS — M2142 Flat foot [pes planus] (acquired), left foot: Secondary | ICD-10-CM | POA: Diagnosis not present

## 2018-10-24 DIAGNOSIS — M21619 Bunion of unspecified foot: Secondary | ICD-10-CM

## 2018-10-24 DIAGNOSIS — M19079 Primary osteoarthritis, unspecified ankle and foot: Secondary | ICD-10-CM | POA: Diagnosis not present

## 2018-10-24 DIAGNOSIS — M2141 Flat foot [pes planus] (acquired), right foot: Secondary | ICD-10-CM

## 2018-10-24 NOTE — Progress Notes (Signed)
Subjective: Eric Roses Sr. is a 78 y.o. male patient with history of diabetes who presents to office today complaining of stiffness swelling and numbness to the bottoms of both feet reports that the stiffness is at all of his toes and numbness to the ball for the last year and a half reports that it feels like a needle sticking type of pain at toes worse at bilateral hallux feet feel swollen but do not look swollen reports that pain is 6 out of 10 on some days however while sitting in chair reports that stiffness is getting a little bit better but numbness always stays.  Patient reports that he is tried some soaking with warm water without any relief denies nausea vomiting fever chills or any other constitutional that this time.  Patient is diabetic last blood sugar of 84 last A1c 8 reports that he was referred here by his primary care doctor at the [facility] reports that over the years has tried many types of insoles in his shoes and reports that he is currently on medication for the nerve issue and restless legs but recently discontinued 1 month ago because he felt like it was not helping.  Patient denies any other pedal complaints at this time.  Review of Systems  All other systems reviewed and are negative.    Patient Active Problem List   Diagnosis Date Noted  . BPH (benign prostatic hyperplasia) 06/28/2017  . CKD (chronic kidney disease) stage 3, GFR 30-59 ml/min (HCC) 06/28/2017  . GERD (gastroesophageal reflux disease) 06/28/2017  . Gout 06/28/2017  . HLD (hyperlipidemia) 06/28/2017  . HTN (hypertension) 06/28/2017  . OSA (obstructive sleep apnea) 06/28/2017  . Paroxysmal atrial fibrillation ([place]) 06/28/2017  . Type 2 diabetes mellitus ([place]) 06/28/2017  . Arthritis of foot, left 06/26/2017  . 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 Posterior tibial tendinitis, left leg 06/26/2017  . Ingrown nail 11/20/2014  . Painful legs and moving toes 11/20/2014  . Onychomycosis 11/20/2014  . Arthritis  of ankle joint 11/20/2014   Current Outpatient Medications on File Prior to Visit  Medication Sig Dispense Refill  . allopurinol (ZYLOPRIM) 100 MG tablet Take 100 mg by mouth daily.    Marland Kitchen amLODipine (NORVASC) 10 MG tablet Take 10 mg by mouth daily.    Marland Kitchen Apixaban (ELIQUIS PO) Take 5 mg by mouth 2 (two) times daily.    . Cholecalciferol (VITAMIN D3 PO) Take 1,000 Units by mouth.    . colchicine 0.6 MG tablet Take 0.6 mg by mouth as needed.    . ferrous sulfate 325 (65 FE) MG tablet Take 325 mg by mouth daily with breakfast.    . finasteride (PROSCAR) 5 MG tablet Take 5 mg by mouth daily.    . furosemide (LASIX) 20 MG tablet Take 20 mg by mouth.    . gabapentin (NEURONTIN) 300 MG capsule Take 300 mg by mouth 3 (three) times daily.    Marland Kitchen glipiZIDE (GLUCOTROL) 5 MG tablet Take by mouth daily before breakfast.    . hydrALAZINE (APRESOLINE) 50 MG tablet Take 50 mg by mouth 2 (two) times daily.    . hydrochlorothiazide (HYDRODIURIL) 25 MG tablet Take 25 mg by mouth daily.    . insulin glargine (LANTUS) 100 UNIT/ML injection Inject 18 Units into the skin at bedtime.     Marland Kitchen lisinopril (PRINIVIL,ZESTRIL) 40 MG tablet Take 40 mg by mouth daily.    . metFORMIN (GLUCOPHAGE) 500 MG tablet Take 500-1,000 mg by mouth 2 (two) times daily  with a meal.     . metoprolol tartrate (LOPRESSOR) 50 MG tablet Take 25 mg by mouth 2 (two) times daily.    Marland Kitchen omeprazole (PRILOSEC) 20 MG capsule Take 20 mg by mouth daily.    . rosuvastatin (CRESTOR) 40 MG tablet Take 20 mg by mouth daily.    . tamsulosin (FLOMAX) 0.4 MG CAPS capsule Take 0.4 mg by mouth daily.    . traMADol (ULTRAM) 50 MG tablet Take 50-100 mg by mouth 3 (three) times daily.    . vitamin B-12 (CYANOCOBALAMIN) 500 MCG tablet Take 250 mcg by mouth daily.     No current facility-administered medications on file prior to visit.    Allergies  Allergen Reactions  . Penicillin G Nausea And Vomiting  . Pravastatin Nausea And Vomiting  . Niacin Rash    No  results found for this or any previous visit (from the past 2160 hour(s)).  Objective: General: Patient is awake, alert, and oriented x 3 and in no acute distress.  Integument: Skin is warm, dry and supple bilateral. Nails are short and thick.  No signs of infection. No open lesions or preulcerative lesions present bilateral. Remaining integument unremarkable.  Vasculature:  Dorsalis Pedis pulse 1/4 bilateral. Posterior Tibial pulse  1/4 bilateral.  Capillary fill time <3 sec 1-5 bilateral. Positive hair growth to the level of the digits. Temperature gradient within normal limits. No varicosities present bilateral. No edema present bilateral.   Neurology: The patient has intact sensation measured with a 5.07/10g Semmes Weinstein Monofilament at all pedal sites bilateral . Vibratory sensation diminished bilateral with tuning fork. No Babinski sign present bilateral.  Subjective numbness and needle prick sensations bilateral.  Musculoskeletal: Bunion hammertoe and pes planus pedal deformities noted bilateral. Muscular strength 5/5 in all lower extremity muscular groups bilateral without pain on range of motion; subjective stiffness.  No tenderness with calf compression bilateral.   X-rays left and right foot: Normal osseous mineralization there is mild joint space narrowing at PJ with toe deformity, there is joint space narrowing at the midtarsal joint with breech supportive of pes planus with findings of arthritis, there is room at the TN joint on the with good placement and mild arthritis noted at the ankle.  Soft tissue margins within normal limits.  No other acute findings.  Assessment and Plan: Problem List Items Addressed This Visit    None    Visit Diagnoses    Diabetic polyneuropathy associated with type 2 diabetes mellitus ([place])    -  Primary   Arthritis of foot       Pes planus of both feet       Bunion       Hammer toe, unspecified laterality          -Examined  patient. -Discussed and educated patient on diabetic foot care, especially with regards to the vascular, neurological and musculoskeletal systems.  -Stressed the importance of good glycemic control and the detriment of not controlling glucose levels in relation to the foot. -Related to arthritis and that the abnormal sensation and numbness is likely related to neuropathy advised patient to try over-the-counter topical pain creams or rubs like Biofreeze heat cold or blue emu and also advised patient to try over-the-counter supplements like to tumeric for arthritis and alpha lipoic acid for the nerves -Advised patient if symptoms may worsen may benefit from resuming neuropathy medication however at this time recommend conservative care for the shoes patient is interested in diabetic shoes office to check  benefits and we will schedule accordingly -Answered all patient questions -Patient to return when called for diabetic shoes if any problems or issues arise  Landis Martins, DPM

## 2018-10-24 NOTE — Patient Instructions (Signed)
Recommend  Topical Voltaren, Blue Emu, or CBD oil Recommend  Tumeric for arthritis Alpha Lapoeic Acid suppliment for nerves

## 2018-11-14 ENCOUNTER — Other Ambulatory Visit: Payer: Self-pay | Admitting: Sports Medicine

## 2018-11-14 DIAGNOSIS — M21619 Bunion of unspecified foot: Secondary | ICD-10-CM

## 2018-11-14 DIAGNOSIS — M79671 Pain in right foot: Secondary | ICD-10-CM

## 2018-11-14 DIAGNOSIS — M1990 Unspecified osteoarthritis, unspecified site: Secondary | ICD-10-CM

## 2018-11-14 DIAGNOSIS — M79672 Pain in left foot: Secondary | ICD-10-CM

## 2018-11-23 DIAGNOSIS — E119 Type 2 diabetes mellitus without complications: Secondary | ICD-10-CM | POA: Diagnosis not present

## 2018-11-23 DIAGNOSIS — H25813 Combined forms of age-related cataract, bilateral: Secondary | ICD-10-CM | POA: Diagnosis not present

## 2018-11-28 ENCOUNTER — Other Ambulatory Visit: Payer: Self-pay

## 2018-11-28 ENCOUNTER — Ambulatory Visit: Payer: Medicare Other | Admitting: Orthotics

## 2018-11-28 DIAGNOSIS — M2141 Flat foot [pes planus] (acquired), right foot: Secondary | ICD-10-CM

## 2018-11-28 DIAGNOSIS — M2040 Other hammer toe(s) (acquired), unspecified foot: Secondary | ICD-10-CM

## 2018-11-28 NOTE — Progress Notes (Signed)

## 2019-01-23 ENCOUNTER — Other Ambulatory Visit: Payer: Self-pay

## 2019-01-23 ENCOUNTER — Ambulatory Visit (INDEPENDENT_AMBULATORY_CARE_PROVIDER_SITE_OTHER): Payer: Medicare Other | Admitting: Orthotics

## 2019-01-23 DIAGNOSIS — M2141 Flat foot [pes planus] (acquired), right foot: Secondary | ICD-10-CM | POA: Diagnosis not present

## 2019-01-23 DIAGNOSIS — E1142 Type 2 diabetes mellitus with diabetic polyneuropathy: Secondary | ICD-10-CM

## 2019-01-23 DIAGNOSIS — M21619 Bunion of unspecified foot: Secondary | ICD-10-CM

## 2019-01-23 DIAGNOSIS — M2142 Flat foot [pes planus] (acquired), left foot: Secondary | ICD-10-CM

## 2019-01-23 DIAGNOSIS — M2040 Other hammer toe(s) (acquired), unspecified foot: Secondary | ICD-10-CM

## 2019-01-23 NOTE — Progress Notes (Signed)

## 2019-02-05 DIAGNOSIS — R52 Pain, unspecified: Secondary | ICD-10-CM | POA: Diagnosis not present

## 2019-02-05 DIAGNOSIS — M1712 Unilateral primary osteoarthritis, left knee: Secondary | ICD-10-CM | POA: Diagnosis not present

## 2019-02-15 HISTORY — PX: CATARACT EXTRACTION: SUR2

## 2019-03-19 DIAGNOSIS — M1712 Unilateral primary osteoarthritis, left knee: Secondary | ICD-10-CM | POA: Diagnosis not present

## 2019-04-26 DIAGNOSIS — Z01812 Encounter for preprocedural laboratory examination: Secondary | ICD-10-CM | POA: Diagnosis not present

## 2019-04-26 DIAGNOSIS — Z20822 Contact with and (suspected) exposure to covid-19: Secondary | ICD-10-CM | POA: Diagnosis not present

## 2019-04-26 DIAGNOSIS — H2511 Age-related nuclear cataract, right eye: Secondary | ICD-10-CM | POA: Diagnosis not present

## 2019-07-03 DIAGNOSIS — Z6830 Body mass index (BMI) 30.0-30.9, adult: Secondary | ICD-10-CM | POA: Diagnosis not present

## 2019-07-03 DIAGNOSIS — Z7901 Long term (current) use of anticoagulants: Secondary | ICD-10-CM | POA: Diagnosis not present

## 2019-07-03 DIAGNOSIS — Z9181 History of falling: Secondary | ICD-10-CM | POA: Diagnosis not present

## 2019-07-03 DIAGNOSIS — Z0001 Encounter for general adult medical examination with abnormal findings: Secondary | ICD-10-CM | POA: Diagnosis not present

## 2019-07-04 DIAGNOSIS — E785 Hyperlipidemia, unspecified: Secondary | ICD-10-CM | POA: Diagnosis not present

## 2019-07-04 DIAGNOSIS — D649 Anemia, unspecified: Secondary | ICD-10-CM | POA: Diagnosis not present

## 2019-07-04 DIAGNOSIS — E119 Type 2 diabetes mellitus without complications: Secondary | ICD-10-CM | POA: Diagnosis not present

## 2019-07-04 DIAGNOSIS — R251 Tremor, unspecified: Secondary | ICD-10-CM | POA: Diagnosis not present

## 2019-07-04 DIAGNOSIS — Z1321 Encounter for screening for nutritional disorder: Secondary | ICD-10-CM | POA: Diagnosis not present

## 2019-07-04 DIAGNOSIS — M109 Gout, unspecified: Secondary | ICD-10-CM | POA: Diagnosis not present

## 2019-07-04 DIAGNOSIS — Z0000 Encounter for general adult medical examination without abnormal findings: Secondary | ICD-10-CM | POA: Diagnosis not present

## 2019-07-04 DIAGNOSIS — I10 Essential (primary) hypertension: Secondary | ICD-10-CM | POA: Diagnosis not present

## 2019-07-04 DIAGNOSIS — K219 Gastro-esophageal reflux disease without esophagitis: Secondary | ICD-10-CM | POA: Diagnosis not present

## 2019-07-04 DIAGNOSIS — E559 Vitamin D deficiency, unspecified: Secondary | ICD-10-CM | POA: Diagnosis not present

## 2019-07-04 DIAGNOSIS — R5383 Other fatigue: Secondary | ICD-10-CM | POA: Diagnosis not present

## 2019-07-17 DIAGNOSIS — M1990 Unspecified osteoarthritis, unspecified site: Secondary | ICD-10-CM | POA: Diagnosis not present

## 2019-07-17 DIAGNOSIS — Z6830 Body mass index (BMI) 30.0-30.9, adult: Secondary | ICD-10-CM | POA: Diagnosis not present

## 2019-07-17 DIAGNOSIS — Z9181 History of falling: Secondary | ICD-10-CM | POA: Diagnosis not present

## 2019-07-17 DIAGNOSIS — N289 Disorder of kidney and ureter, unspecified: Secondary | ICD-10-CM | POA: Diagnosis not present

## 2019-09-09 DIAGNOSIS — I129 Hypertensive chronic kidney disease with stage 1 through stage 4 chronic kidney disease, or unspecified chronic kidney disease: Secondary | ICD-10-CM | POA: Diagnosis not present

## 2019-09-09 DIAGNOSIS — N2581 Secondary hyperparathyroidism of renal origin: Secondary | ICD-10-CM | POA: Diagnosis not present

## 2019-09-09 DIAGNOSIS — N1832 Chronic kidney disease, stage 3b: Secondary | ICD-10-CM | POA: Diagnosis not present

## 2019-09-09 DIAGNOSIS — R809 Proteinuria, unspecified: Secondary | ICD-10-CM | POA: Diagnosis not present

## 2019-09-09 DIAGNOSIS — D631 Anemia in chronic kidney disease: Secondary | ICD-10-CM | POA: Diagnosis not present

## 2019-11-20 ENCOUNTER — Other Ambulatory Visit: Payer: Self-pay | Admitting: Nephrology

## 2019-11-20 DIAGNOSIS — N1832 Chronic kidney disease, stage 3b: Secondary | ICD-10-CM

## 2019-11-20 DIAGNOSIS — R809 Proteinuria, unspecified: Secondary | ICD-10-CM

## 2019-11-26 ENCOUNTER — Ambulatory Visit
Admission: RE | Admit: 2019-11-26 | Discharge: 2019-11-26 | Disposition: A | Discharge status: Home / Self Care | Payer: Medicare Other | Source: Ambulatory Visit | Attending: Nephrology | Admitting: Nephrology

## 2019-11-26 DIAGNOSIS — N281 Cyst of kidney, acquired: Secondary | ICD-10-CM | POA: Diagnosis not present

## 2019-11-26 DIAGNOSIS — N1832 Chronic kidney disease, stage 3b: Secondary | ICD-10-CM

## 2019-11-26 DIAGNOSIS — N3289 Other specified disorders of bladder: Secondary | ICD-10-CM | POA: Diagnosis not present

## 2019-11-26 DIAGNOSIS — R809 Proteinuria, unspecified: Secondary | ICD-10-CM

## 2019-12-12 DIAGNOSIS — N1832 Chronic kidney disease, stage 3b: Secondary | ICD-10-CM | POA: Diagnosis not present

## 2019-12-12 DIAGNOSIS — N183 Chronic kidney disease, stage 3 unspecified: Secondary | ICD-10-CM | POA: Diagnosis not present

## 2019-12-12 DIAGNOSIS — D631 Anemia in chronic kidney disease: Secondary | ICD-10-CM | POA: Diagnosis not present

## 2019-12-12 DIAGNOSIS — R809 Proteinuria, unspecified: Secondary | ICD-10-CM | POA: Diagnosis not present

## 2019-12-12 DIAGNOSIS — I129 Hypertensive chronic kidney disease with stage 1 through stage 4 chronic kidney disease, or unspecified chronic kidney disease: Secondary | ICD-10-CM | POA: Diagnosis not present

## 2019-12-31 DIAGNOSIS — Z006 Encounter for examination for normal comparison and control in clinical research program: Secondary | ICD-10-CM | POA: Insufficient documentation

## 2019-12-31 HISTORY — DX: Encounter for examination for normal comparison and control in clinical research program: Z00.6

## 2020-01-06 DIAGNOSIS — N138 Other obstructive and reflux uropathy: Secondary | ICD-10-CM | POA: Diagnosis not present

## 2020-01-06 DIAGNOSIS — N529 Male erectile dysfunction, unspecified: Secondary | ICD-10-CM | POA: Diagnosis not present

## 2020-01-06 DIAGNOSIS — N401 Enlarged prostate with lower urinary tract symptoms: Secondary | ICD-10-CM | POA: Diagnosis not present

## 2020-02-04 DIAGNOSIS — K8050 Calculus of bile duct without cholangitis or cholecystitis without obstruction: Secondary | ICD-10-CM | POA: Diagnosis not present

## 2020-02-04 DIAGNOSIS — R1084 Generalized abdominal pain: Secondary | ICD-10-CM | POA: Diagnosis not present

## 2020-02-04 DIAGNOSIS — R52 Pain, unspecified: Secondary | ICD-10-CM | POA: Diagnosis not present

## 2020-02-04 DIAGNOSIS — R109 Unspecified abdominal pain: Secondary | ICD-10-CM | POA: Diagnosis not present

## 2020-02-04 DIAGNOSIS — K8020 Calculus of gallbladder without cholecystitis without obstruction: Secondary | ICD-10-CM | POA: Diagnosis not present

## 2020-02-15 HISTORY — PX: PENILE PROSTHESIS IMPLANT: SHX240

## 2020-03-05 ENCOUNTER — Other Ambulatory Visit: Payer: Self-pay

## 2020-03-05 ENCOUNTER — Encounter: Payer: Self-pay | Admitting: Podiatry

## 2020-03-05 ENCOUNTER — Ambulatory Visit (INDEPENDENT_AMBULATORY_CARE_PROVIDER_SITE_OTHER): Payer: Medicare Other | Admitting: Podiatry

## 2020-03-05 DIAGNOSIS — G588 Other specified mononeuropathies: Secondary | ICD-10-CM | POA: Diagnosis not present

## 2020-03-05 MED ORDER — BETAMETHASONE SOD PHOS & ACET 6 (3-3) MG/ML IJ SUSP: 12.0000 mg | Freq: Once | INTRAMUSCULAR | Status: AC

## 2020-03-05 NOTE — Progress Notes (Signed)
  Subjective:  Patient ID: Eric Hammed Sr., male    DOB: [birth year],  MRN: [id number]  Chief Complaint  Patient presents with  . Foot Problem    I have some stiffness and the pain is worse than before and the inserts don't help at all and the arch seems to be hurting    80 y.o. male presents with the above complaint. History confirmed with patient.   Objective:  Physical Exam: warm, good capillary refill, no trophic changes or ulcerative lesions, normal DP and PT pulses and normal sensory exam. Left Foot: tenderness at 2nd/3rd interspace. Neg mulder's click Right Foot: tenderness at 2nd/3rd interspace. Neg mulder's click  Assessment:   1. Interdigital neuroma      Plan:  Patient was evaluated and treated and all questions answered.  Interdigital Neuroma  -Though has dx of neuropathy some of his symptoms do appear to be local nerve inflammation. -Educated on etiology -Educated on padding and proper shoegear -Injection delivered to the affected interspaces  Procedure: Neuroma Injection Location: Bilateral 2nd,3rd interspace Skin Prep: Alcohol. Injectate: 0.5 cc 0.5% marcaine plain, 0.5 cc celestone each interspace Disposition: Patient tolerated procedure well. Injection site dressed with a band-aid.  No follow-ups on file.

## 2020-04-06 ENCOUNTER — Other Ambulatory Visit: Payer: Self-pay

## 2020-04-06 ENCOUNTER — Ambulatory Visit (INDEPENDENT_AMBULATORY_CARE_PROVIDER_SITE_OTHER): Payer: Medicare Other | Admitting: Podiatry

## 2020-04-06 ENCOUNTER — Encounter: Payer: Self-pay | Admitting: Podiatry

## 2020-04-06 DIAGNOSIS — IMO0001 Reserved for inherently not codable concepts without codable children: Secondary | ICD-10-CM

## 2020-04-06 DIAGNOSIS — R350 Frequency of micturition: Secondary | ICD-10-CM | POA: Insufficient documentation

## 2020-04-06 DIAGNOSIS — N529 Male erectile dysfunction, unspecified: Secondary | ICD-10-CM

## 2020-04-06 DIAGNOSIS — G588 Other specified mononeuropathies: Secondary | ICD-10-CM | POA: Diagnosis not present

## 2020-04-06 DIAGNOSIS — I120 Hypertensive chronic kidney disease with stage 5 chronic kidney disease or end stage renal disease: Secondary | ICD-10-CM | POA: Insufficient documentation

## 2020-04-06 DIAGNOSIS — R251 Tremor, unspecified: Secondary | ICD-10-CM | POA: Insufficient documentation

## 2020-04-06 DIAGNOSIS — Z23 Encounter for immunization: Secondary | ICD-10-CM

## 2020-04-06 DIAGNOSIS — M109 Gout, unspecified: Secondary | ICD-10-CM

## 2020-04-06 DIAGNOSIS — R69 Illness, unspecified: Secondary | ICD-10-CM

## 2020-04-06 DIAGNOSIS — D500 Iron deficiency anemia secondary to blood loss (chronic): Secondary | ICD-10-CM

## 2020-04-06 DIAGNOSIS — N419 Inflammatory disease of prostate, unspecified: Secondary | ICD-10-CM

## 2020-04-06 DIAGNOSIS — E1140 Type 2 diabetes mellitus with diabetic neuropathy, unspecified: Secondary | ICD-10-CM | POA: Insufficient documentation

## 2020-04-06 HISTORY — DX: Illness, unspecified: R69

## 2020-04-06 HISTORY — DX: Iron deficiency anemia secondary to blood loss (chronic): D50.0

## 2020-04-06 HISTORY — DX: Type 2 diabetes mellitus with diabetic neuropathy, unspecified: E11.40

## 2020-04-06 HISTORY — DX: Encounter for immunization: Z23

## 2020-04-06 HISTORY — DX: Male erectile dysfunction, unspecified: N52.9

## 2020-04-06 HISTORY — DX: Frequency of micturition: R35.0

## 2020-04-06 HISTORY — DX: Hypertensive chronic kidney disease with stage 5 chronic kidney disease or end stage renal disease: I12.0

## 2020-04-06 HISTORY — DX: Tremor, unspecified: R25.1

## 2020-04-06 HISTORY — DX: Inflammatory disease of prostate, unspecified: N41.9

## 2020-04-06 HISTORY — DX: Gout, unspecified: M10.9

## 2020-04-06 HISTORY — DX: Reserved for inherently not codable concepts without codable children: IMO0001

## 2020-04-13 DIAGNOSIS — G588 Other specified mononeuropathies: Secondary | ICD-10-CM | POA: Diagnosis not present

## 2020-04-13 MED ORDER — BETAMETHASONE SOD PHOS & ACET 6 (3-3) MG/ML IJ SUSP
6.0000 mg | Freq: Once | INTRAMUSCULAR | Status: AC
  Administered 2020-04-13: 6 mg

## 2020-04-13 NOTE — Progress Notes (Signed)
  Subjective:  Patient ID: Eric Hammed Sr., male    DOB: 17-Oct-1940,  MRN: [id number]  Chief Complaint  Patient presents with  . Foot Problem    The shot did help for a time and my feet just hurt in general   80 y.o. male presents with the above complaint. History confirmed with patient.   Objective:  Physical Exam: warm, good capillary refill, no trophic changes or ulcerative lesions, normal DP and PT pulses and normal sensory exam. Left Foot: tenderness at 2nd/3rd interspace. Neg mulder's click Right Foot: tenderness at 2nd/3rd interspace. Neg mulder's click  Assessment:   1. Interdigital neuroma      Plan:  Patient was evaluated and treated and all questions answered.  Interdigital Neuroma  -Repeat injections as below  Procedure: Neuroma Injection Location: Bilateral 2nd interspace Skin Prep: Alcohol. Injectate: 0.5 cc 0.5% marcaine plain, 0.5 cc betamethasone acetate-betamethasone sodium phosphate Disposition: Patient tolerated procedure well. Injection site dressed with a band-aid.   No follow-ups on file.

## 2020-04-27 ENCOUNTER — Other Ambulatory Visit: Payer: Self-pay

## 2020-04-27 ENCOUNTER — Ambulatory Visit (INDEPENDENT_AMBULATORY_CARE_PROVIDER_SITE_OTHER): Payer: Medicare Other | Admitting: Podiatry

## 2020-04-27 DIAGNOSIS — G588 Other specified mononeuropathies: Secondary | ICD-10-CM

## 2020-04-27 NOTE — Progress Notes (Signed)
  Subjective:  Patient ID: Eric Hammed Sr., male    DOB: 22-Apr-1940,  MRN: [id number]  Chief Complaint  Patient presents with  . Neuroma    F/U BL neuroma -pt states,' pain is the same,no change." -pt denies improvement ; 8/10 constant pain -with tight feeling at toes and tingling Tx: none    80 y.o. male presents with the above complaint. History confirmed with patient.   Objective:  Physical Exam: warm, good capillary refill, no trophic changes or ulcerative lesions, normal DP and PT pulses and normal sensory exam. Left Foot: tenderness at 2nd/3rd interspace. Neg mulder's click Right Foot: tenderness at 2nd/3rd interspace. Neg mulder's click  Assessment:   1. Interdigital neuroma    Plan:  Patient was evaluated and treated and all questions answered.  Interdigital Neuroma  -Start sclerosing therapy  Procedure: Neurolysis Location: Bilateral 2nd interspace Skin Prep: Alcohol. Injectate: 4% alcohol sclerosing injection. Disposition: Patient tolerated procedure well. Injection site dressed with a band-aid.   Return in about 2 weeks (around 05/11/2020) for Neuroma sclerosing injection.

## 2020-04-28 DIAGNOSIS — M25562 Pain in left knee: Secondary | ICD-10-CM | POA: Diagnosis not present

## 2020-04-28 DIAGNOSIS — G8929 Other chronic pain: Secondary | ICD-10-CM | POA: Diagnosis not present

## 2020-04-28 DIAGNOSIS — M1712 Unilateral primary osteoarthritis, left knee: Secondary | ICD-10-CM | POA: Diagnosis not present

## 2020-05-11 ENCOUNTER — Ambulatory Visit (INDEPENDENT_AMBULATORY_CARE_PROVIDER_SITE_OTHER): Payer: Medicare Other | Admitting: Podiatry

## 2020-05-11 ENCOUNTER — Other Ambulatory Visit: Payer: Self-pay

## 2020-05-11 DIAGNOSIS — E1165 Type 2 diabetes mellitus with hyperglycemia: Secondary | ICD-10-CM

## 2020-05-11 DIAGNOSIS — G588 Other specified mononeuropathies: Secondary | ICD-10-CM | POA: Diagnosis not present

## 2020-05-11 HISTORY — DX: Type 2 diabetes mellitus with hyperglycemia: E11.65

## 2020-05-11 NOTE — Progress Notes (Signed)
  Subjective:  Patient ID: Fredda Hammed Sr., male    DOB: [birth year],  MRN: [id number]  No chief complaint on file.  80 y.o. male presents for follow up. States the injection last visit helped more than the previous ones. Still feels stiffness in his feet.  Objective:  Physical Exam: warm, good capillary refill, no trophic changes or ulcerative lesions, normal DP and PT pulses and normal sensory exam. Left Foot: tenderness at 2nd/3rd interspace. Neg mulder's click Right Foot: tenderness at 2nd/3rd interspace. Neg mulder's click  Assessment:   1. Interdigital neuroma    Plan:  Patient was evaluated and treated and all questions answered.  Interdigital Neuroma  -Sclerosing injection #2  Procedure: Neurolysis Location: Bilateral 2ne interspace Skin Prep: Alcohol. Injectate: 4% alcohol sclerosing injection. Disposition: Patient tolerated procedure well. Injection site dressed with a band-aid.   Return in about 2 weeks (around 05/25/2020) for Sclerosing injection.

## 2020-05-25 ENCOUNTER — Other Ambulatory Visit: Payer: Self-pay

## 2020-05-25 ENCOUNTER — Ambulatory Visit (INDEPENDENT_AMBULATORY_CARE_PROVIDER_SITE_OTHER): Payer: Medicare Other | Admitting: Podiatry

## 2020-05-25 DIAGNOSIS — G588 Other specified mononeuropathies: Secondary | ICD-10-CM | POA: Diagnosis not present

## 2020-05-25 NOTE — Progress Notes (Signed)
  Subjective:  Patient ID: Eric Hammed Sr., male    DOB: [birth year],  MRN: [id number]  Chief Complaint  Patient presents with  . Neuroma    F/U BL neuroma -pt states," abou the same, no change." - 6/10 occasional pain -w/ numnbess and tingling   80 y.o. male presents for follow up. States that the injections help for a good bit but then wear off and his foot feels the same.  Objective:  Physical Exam: warm, good capillary refill, no trophic changes or ulcerative lesions, normal DP and PT pulses and normal sensory exam. Left Foot: tenderness at 2nd/3rd interspace. Neg mulder's click Right Foot: tenderness at 2nd/3rd interspace. Neg mulder's click  Assessment:   1. Interdigital neuroma    Plan:  Patient was evaluated and treated and all questions answered.  Interdigital Neuroma  -Sclerosing injection #3  Procedure: Neurolysis Location: Bilateral 2nd interspace Skin Prep: Alcohol. Injectate: 4% alcohol sclerosing injection. Disposition: Patient tolerated procedure well. Injection site dressed with a band-aid.   Return in about 2 weeks (around 06/08/2020) for sclerosin injection.

## 2020-06-08 ENCOUNTER — Ambulatory Visit: Payer: Medicare Other | Admitting: Podiatry

## 2020-06-11 DIAGNOSIS — L03317 Cellulitis of buttock: Secondary | ICD-10-CM | POA: Diagnosis not present

## 2020-06-12 DIAGNOSIS — I129 Hypertensive chronic kidney disease with stage 1 through stage 4 chronic kidney disease, or unspecified chronic kidney disease: Secondary | ICD-10-CM | POA: Diagnosis not present

## 2020-06-12 DIAGNOSIS — D631 Anemia in chronic kidney disease: Secondary | ICD-10-CM | POA: Diagnosis not present

## 2020-06-12 DIAGNOSIS — N1832 Chronic kidney disease, stage 3b: Secondary | ICD-10-CM | POA: Diagnosis not present

## 2020-06-12 DIAGNOSIS — R809 Proteinuria, unspecified: Secondary | ICD-10-CM | POA: Diagnosis not present

## 2020-06-12 DIAGNOSIS — N2581 Secondary hyperparathyroidism of renal origin: Secondary | ICD-10-CM | POA: Diagnosis not present

## 2020-07-06 DIAGNOSIS — N1832 Chronic kidney disease, stage 3b: Secondary | ICD-10-CM | POA: Diagnosis not present

## 2020-12-07 DIAGNOSIS — Z978 Presence of other specified devices: Secondary | ICD-10-CM | POA: Diagnosis not present

## 2020-12-07 DIAGNOSIS — M1612 Unilateral primary osteoarthritis, left hip: Secondary | ICD-10-CM | POA: Diagnosis not present

## 2020-12-07 DIAGNOSIS — M25552 Pain in left hip: Secondary | ICD-10-CM | POA: Diagnosis not present

## 2020-12-07 DIAGNOSIS — M25562 Pain in left knee: Secondary | ICD-10-CM | POA: Diagnosis not present

## 2020-12-07 DIAGNOSIS — Z96641 Presence of right artificial hip joint: Secondary | ICD-10-CM | POA: Diagnosis not present

## 2020-12-07 DIAGNOSIS — M1712 Unilateral primary osteoarthritis, left knee: Secondary | ICD-10-CM | POA: Diagnosis not present

## 2020-12-11 DIAGNOSIS — N1832 Chronic kidney disease, stage 3b: Secondary | ICD-10-CM | POA: Diagnosis not present

## 2020-12-11 DIAGNOSIS — N2581 Secondary hyperparathyroidism of renal origin: Secondary | ICD-10-CM | POA: Diagnosis not present

## 2020-12-11 DIAGNOSIS — D631 Anemia in chronic kidney disease: Secondary | ICD-10-CM | POA: Diagnosis not present

## 2020-12-11 DIAGNOSIS — N183 Chronic kidney disease, stage 3 unspecified: Secondary | ICD-10-CM | POA: Diagnosis not present

## 2020-12-11 DIAGNOSIS — I129 Hypertensive chronic kidney disease with stage 1 through stage 4 chronic kidney disease, or unspecified chronic kidney disease: Secondary | ICD-10-CM | POA: Diagnosis not present

## 2020-12-11 DIAGNOSIS — R809 Proteinuria, unspecified: Secondary | ICD-10-CM | POA: Diagnosis not present

## 2021-01-06 DIAGNOSIS — R202 Paresthesia of skin: Secondary | ICD-10-CM | POA: Diagnosis not present

## 2021-01-06 DIAGNOSIS — E119 Type 2 diabetes mellitus without complications: Secondary | ICD-10-CM | POA: Diagnosis not present

## 2021-01-06 DIAGNOSIS — R200 Anesthesia of skin: Secondary | ICD-10-CM | POA: Diagnosis not present

## 2021-01-13 DIAGNOSIS — M12811 Other specific arthropathies, not elsewhere classified, right shoulder: Secondary | ICD-10-CM

## 2021-01-13 DIAGNOSIS — M12812 Other specific arthropathies, not elsewhere classified, left shoulder: Secondary | ICD-10-CM | POA: Insufficient documentation

## 2021-01-13 HISTORY — DX: Other specific arthropathies, not elsewhere classified, right shoulder: M12.811

## 2021-01-13 HISTORY — DX: Other specific arthropathies, not elsewhere classified, left shoulder: M12.812

## 2021-01-26 DIAGNOSIS — M19079 Primary osteoarthritis, unspecified ankle and foot: Secondary | ICD-10-CM | POA: Diagnosis not present

## 2021-01-26 DIAGNOSIS — M722 Plantar fascial fibromatosis: Secondary | ICD-10-CM

## 2021-01-26 DIAGNOSIS — M25571 Pain in right ankle and joints of right foot: Secondary | ICD-10-CM | POA: Diagnosis not present

## 2021-01-26 DIAGNOSIS — M79671 Pain in right foot: Secondary | ICD-10-CM | POA: Diagnosis not present

## 2021-01-26 HISTORY — DX: Plantar fascial fibromatosis: M72.2

## 2021-01-27 DIAGNOSIS — M19079 Primary osteoarthritis, unspecified ankle and foot: Secondary | ICD-10-CM | POA: Insufficient documentation

## 2021-01-27 HISTORY — DX: Primary osteoarthritis, unspecified ankle and foot: M19.079

## 2021-01-29 DIAGNOSIS — M722 Plantar fascial fibromatosis: Secondary | ICD-10-CM | POA: Diagnosis not present

## 2021-02-19 DIAGNOSIS — M722 Plantar fascial fibromatosis: Secondary | ICD-10-CM | POA: Diagnosis not present

## 2021-03-04 DIAGNOSIS — M722 Plantar fascial fibromatosis: Secondary | ICD-10-CM | POA: Diagnosis not present

## 2021-04-13 DIAGNOSIS — M1712 Unilateral primary osteoarthritis, left knee: Secondary | ICD-10-CM | POA: Diagnosis not present

## 2021-04-13 DIAGNOSIS — M25561 Pain in right knee: Secondary | ICD-10-CM | POA: Diagnosis not present

## 2021-04-13 DIAGNOSIS — M25562 Pain in left knee: Secondary | ICD-10-CM | POA: Diagnosis not present

## 2021-04-13 DIAGNOSIS — M1711 Unilateral primary osteoarthritis, right knee: Secondary | ICD-10-CM | POA: Diagnosis not present

## 2021-05-11 DIAGNOSIS — M1712 Unilateral primary osteoarthritis, left knee: Secondary | ICD-10-CM | POA: Diagnosis not present

## 2021-06-01 IMAGING — US US RENAL
1 series · 14 of 25 positions shown · non-contrast
Comparison: CT abdomen and pelvis 05/13/2018

CLINICAL DATA: Chronic kidney disease stage 3B. Proteinuria.
Diabetes.

EXAM:
RENAL / URINARY TRACT ULTRASOUND COMPLETE

[Series 1: us renal · 0.26mm/px · 14 of 58 slices shown]
[im 1/58]
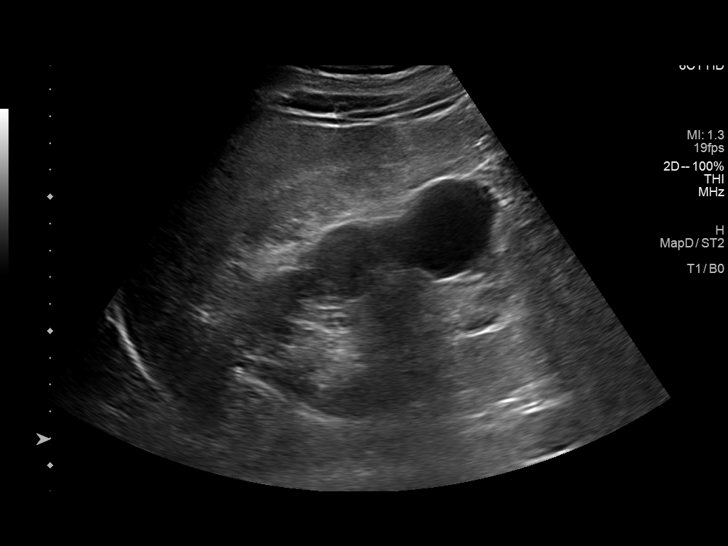
[im 5/58]
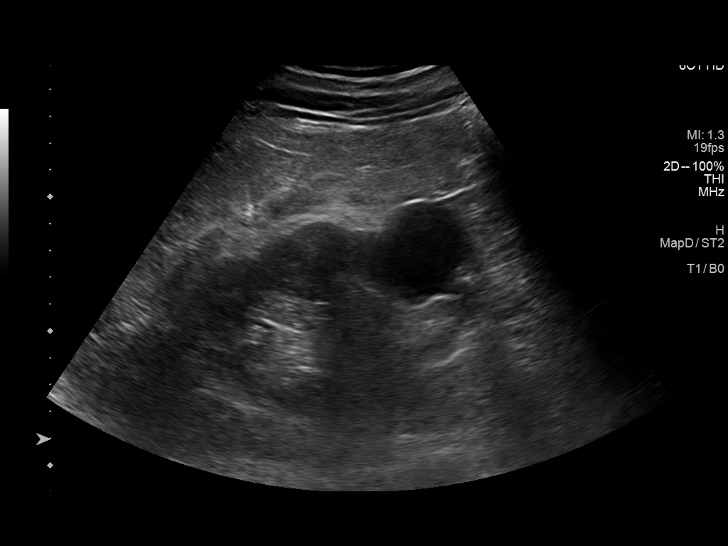
[im 10/58]
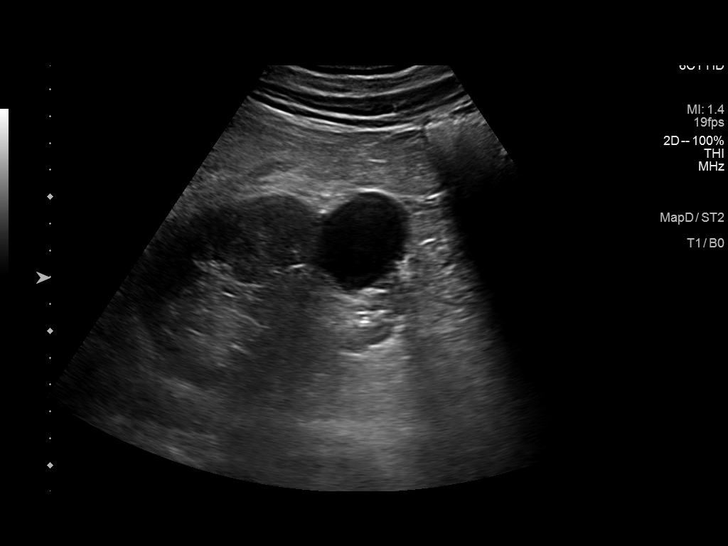
[im 15/58]
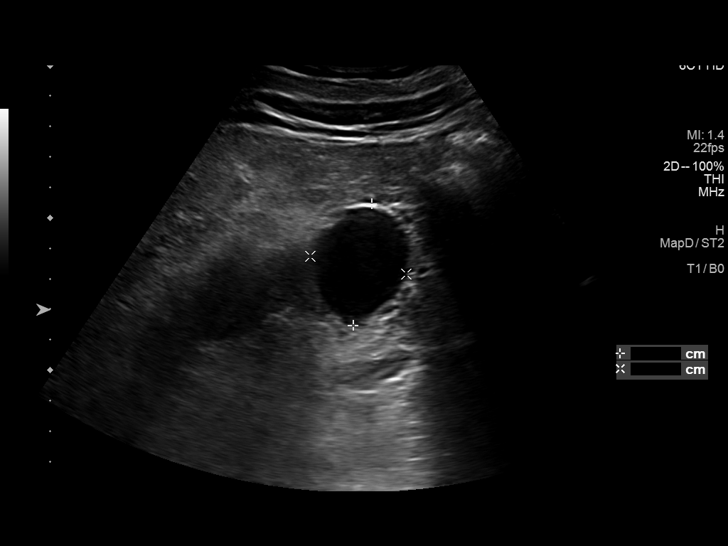
[im 20/58]
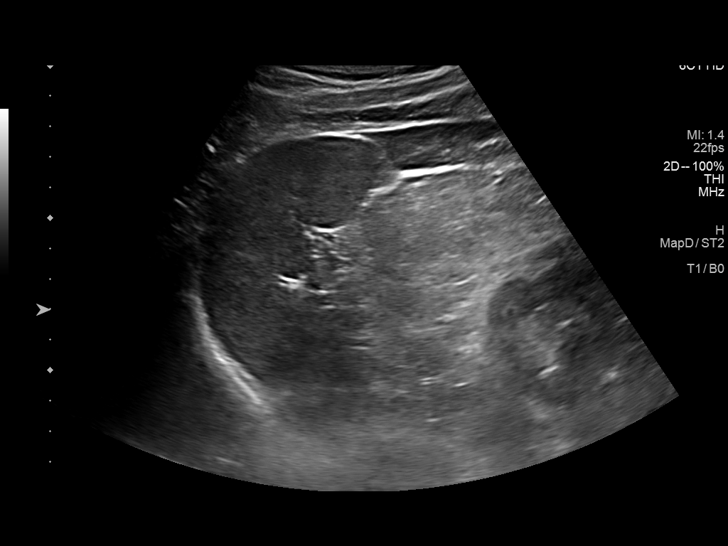
[im 22/58]
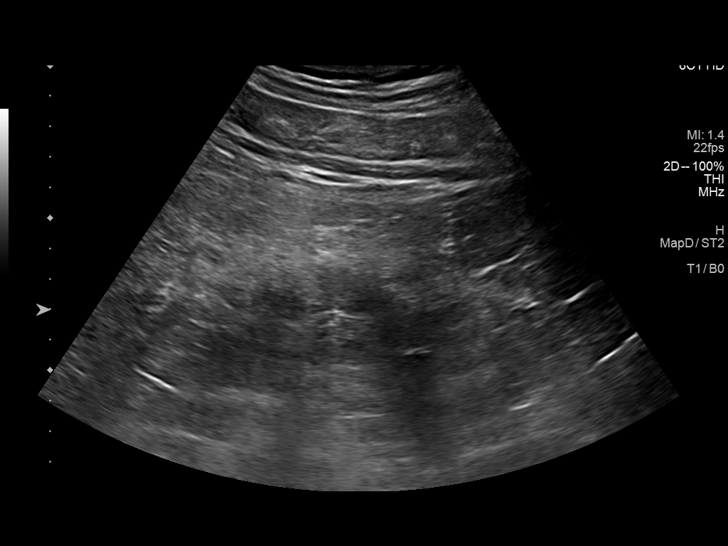
[im 27/58]
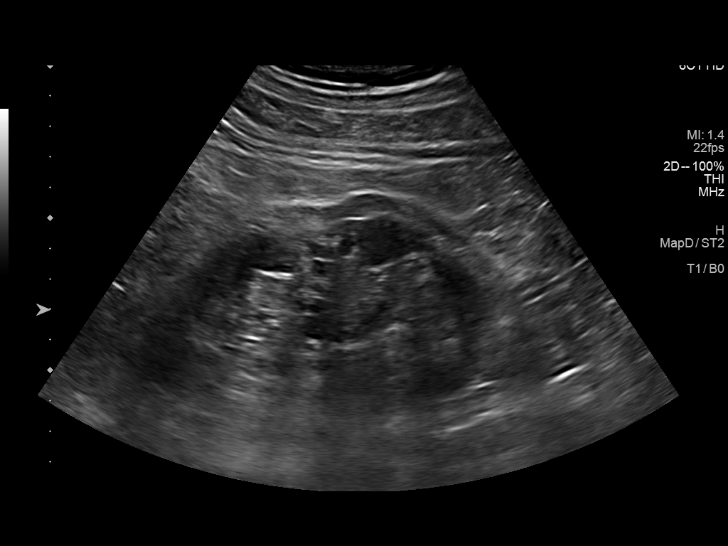
[im 31/58]
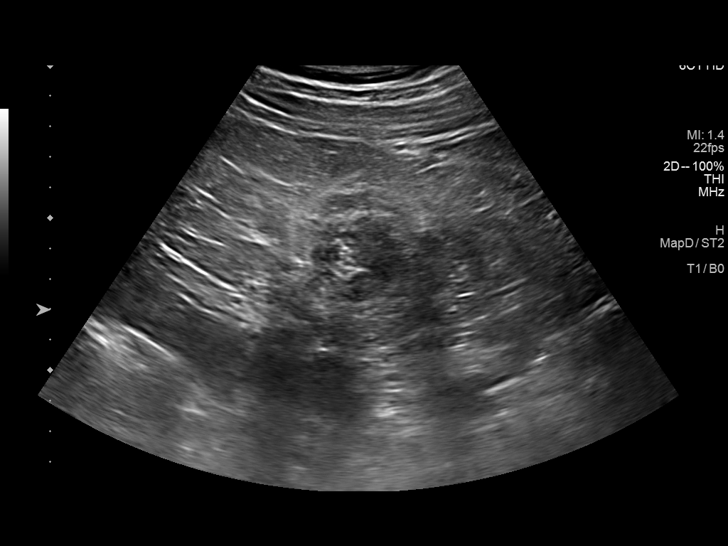
[im 36/58]
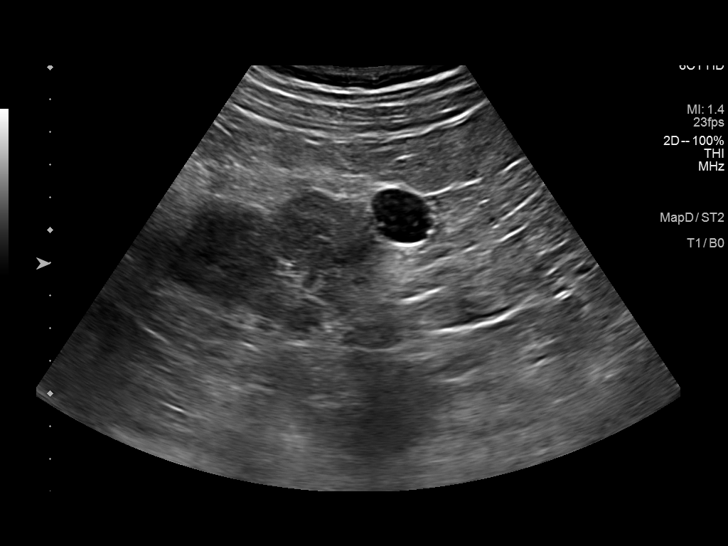
[im 39/58]
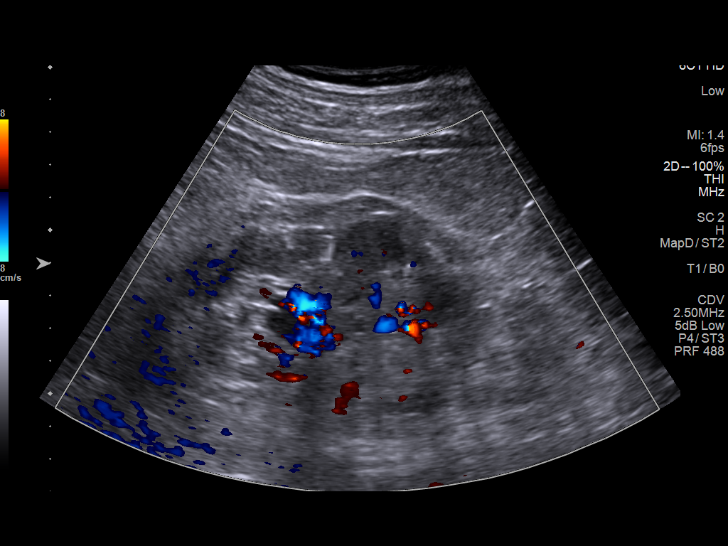
[im 43/58]
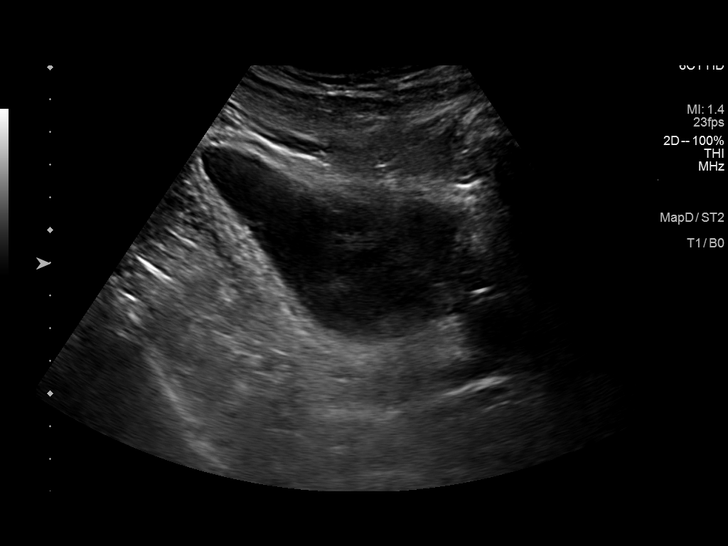
[im 48/58]
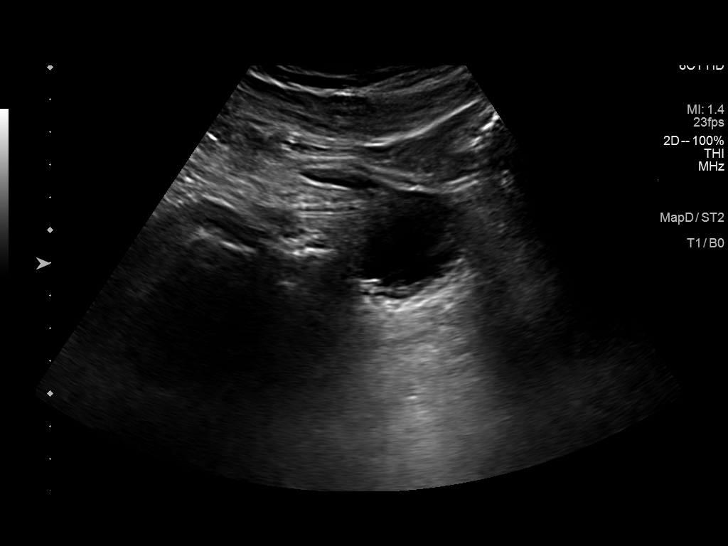
[im 53/58]
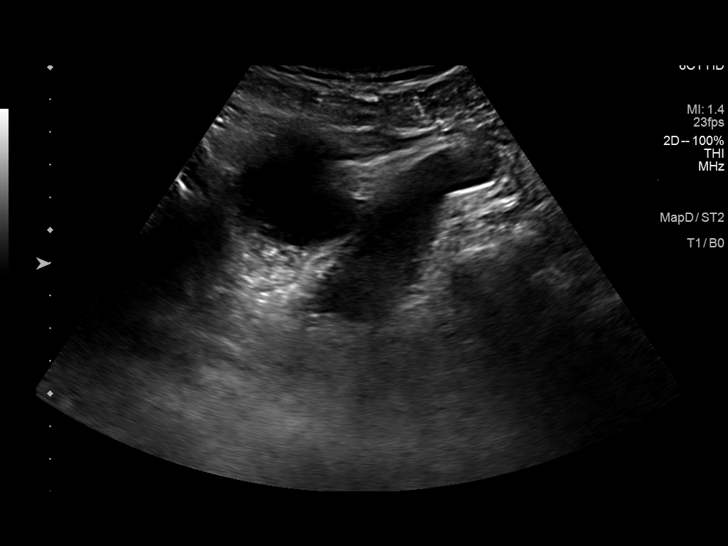
[im 58/58]
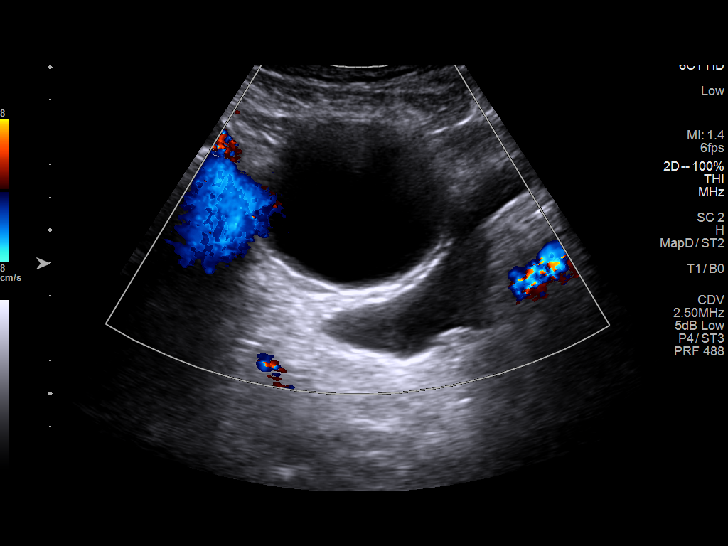

[14 of 25 positions shown; findings below may reference images not displayed]

FINDINGS: Right Kidney:

Renal measurements: 12.0 x 6.7 x 6.2 cm = volume: 242 mL.
Echogenicity within normal limits. No hydronephrosis. 4.0 cm lower
pole cyst, similar in size to the prior CT.

Left Kidney:

Renal measurements: 9.8 x 10.2 x 5.5 cm = volume: 289 mL.
Echogenicity within normal limits. Renal scarring. No
hydronephrosis. 2.6 cm upper pole renal sinus cyst and 1.9 cm
exophytic lower pole cyst, both similar in size to the prior CT.

Bladder:

Appears normal for degree of bladder distention.

Other:

Cystic structure adjacent to the bladder reflecting the reservoir of
a penile prosthesis.
IMPRESSION: 1. No hydronephrosis.
2. Bilateral renal cysts.

## 2021-09-06 DIAGNOSIS — M1711 Unilateral primary osteoarthritis, right knee: Secondary | ICD-10-CM | POA: Diagnosis not present

## 2021-09-06 DIAGNOSIS — M1712 Unilateral primary osteoarthritis, left knee: Secondary | ICD-10-CM | POA: Diagnosis not present

## 2021-09-06 DIAGNOSIS — M170 Bilateral primary osteoarthritis of knee: Secondary | ICD-10-CM | POA: Diagnosis not present

## 2021-09-24 NOTE — Progress Notes (Signed)
Sent message, via epic in basket, requesting order in epic from surgeon

## 2021-09-28 NOTE — Patient Instructions (Addendum)
DUE TO COVID-19 ONLY TWO VISITORS  (aged 81 and older)  ARE ALLOWED TO COME WITH YOU AND STAY IN THE WAITING ROOM ONLY DURING PRE OP AND PROCEDURE.   **NO VISITORS ARE ALLOWED IN THE SHORT STAY AREA OR RECOVERY ROOM!!**  IF YOU WILL BE ADMITTED INTO THE HOSPITAL YOU ARE ALLOWED ONLY FOUR SUPPORT PEOPLE DURING VISITATION HOURS ONLY (7 AM -8PM)   The support person(s) must pass our screening, gel in and out, and wear a mask at all times, including in the patient's room. Patients must also wear a mask when staff or their support person are in the room. Visitors GUEST BADGE MUST BE WORN VISIBLY  One adult visitor may remain with you overnight and MUST be in the room by 8 P.M.     Your procedure is scheduled on: 10/13/21   Report to [facility] Main Entrance    Report to admitting at  8:30 AM   Call this number if you have problems the morning of surgery [phone]   Do not eat food :After Midnight.   After Midnight you may have the following liquids until __8:00____ AM/  DAY OF SURGERY  Water Black Coffee (sugar ok, NO MILK/CREAM OR CREAMERS)  Tea (sugar ok, NO MILK/CREAM OR CREAMERS) regular and decaf                             Plain Jell-O (NO RED)                                           Fruit ices (not with fruit pulp, NO RED)                                     Popsicles (NO RED)                                                                  Juice: apple, WHITE grape, WHITE cranberry Sports drinks like Gatorade (NO RED)                   The day of surgery:  Drink ONE G2 at 7:45 AM the morning of surgery. Drink in one sitting. Do not sip.  This drink was given to you during your hospital  pre-op appointment visit. Nothing else to drink after completing the  G2. At 8:00 am          If you have questions, please contact your surgeon's office.       Oral Hygiene is also important to reduce your risk of infection.                                    Remember -  BRUSH YOUR TEETH THE MORNING OF SURGERY WITH YOUR REGULAR TOOTHPASTE   Do NOT smoke after Midnight   Take these medicines the morning of surgery with A SIP OF WATER: Gabapentin, Crestor, Hydralazine, Omeprazole, Metoprolol, Amlodipine, Allopurinol, Finasteride, Tamsulosin  Before surgery.Stop taking _Eliquis__________on __8/26/23________as instructed by _____________.   Contact your Surgeon/Cardiologist for instructions on Anticoagulant Therapy prior to surgery.   How to Manage Your Diabetes Before and After Surgery  Why is it important to control my blood sugar before and after surgery? Improving blood sugar levels before and after surgery helps healing and can limit problems. A way of improving blood sugar control is eating a healthy diet by:  Eating less sugar and carbohydrates  Increasing activity/exercise  Talking with your doctor about reaching your blood sugar goals High blood sugars (greater than 180 mg/dL) can raise your risk of infections and slow your recovery, so you will need to focus on controlling your diabetes during the weeks before surgery. Make sure that the doctor who takes care of your diabetes knows about your planned surgery including the date and location.  How do I manage my blood sugar before surgery? Check your blood sugar at least 4 times a day, starting 2 days before surgery, to make sure that the level is not too high or low. Check your blood sugar the morning of your surgery when you wake up and every 2 hours until you get to the Short Stay unit. If your blood sugar is less than 70 mg/dL, you will need to treat for low blood sugar: Do not take insulin. Treat a low blood sugar (less than 70 mg/dL) with  cup of clear juice (cranberry or apple), 4 glucose tablets, OR glucose gel. Recheck blood sugar in 15 minutes after treatment (to make sure it is greater than 70 mg/dL). If your blood sugar is not greater than 70 mg/dL on recheck, call [phone] for  further instructions. Report your blood sugar to the short stay nurse when you get to Short Stay.  If you are admitted to the hospital after surgery: Your blood sugar will be checked by the staff and you will probably be given insulin after surgery (instead of oral diabetes medicines) to make sure you have good blood sugar levels. The goal for blood sugar control after surgery is 80-180 mg/dL.   WHAT DO I DO ABOUT MY DIABETES MEDICATION?  Do not take oral diabetes medicines (pills) the morning of surgery.  THE NIGHT BEFORE SURGERY, take 0    units of Lantus  insulin.        The day of surgery, do not take other diabetes injectables, including Byetta (exenatide), Bydureon (exenatide ER), Victoza (liraglutide), or Trulicity (dulaglutide).      Bring CPAP mask and tubing day of surgery.                              You may not have any metal on your body including  jewelry, and body piercing             Do not wear  lotions, powders, perfumes/cologne, or deodorant               Men may shave face and neck.   Do not bring valuables to the hospital. [place].   Contacts, dentures or bridgework may not be worn into surgery.   Bring small overnight bag day of surgery.   DO NOT [place]. PHARMACY WILL DISPENSE MEDICATIONS LISTED ON YOUR MEDICATION LIST TO YOU DURING YOUR ADMISSION [place]!  Special Instructions: Bring a copy of your healthcare power of attorney and living will documents the day of surgery if you haven't scanned them before.              Please read over the following fact sheets you were given: IF YOU HAVE QUESTIONS ABOUT YOUR PRE-OP INSTRUCTIONS PLEASE CALL [phone]     [facility] - Preparing for Surgery Before surgery, you can play an important role.  Because skin is not sterile, your skin needs to be as free of germs as possible.  You can reduce the number  of germs on your skin by washing with CHG (chlorahexidine gluconate) soap before surgery.  CHG is an antiseptic cleaner which kills germs and bonds with the skin to continue killing germs even after washing. Please DO NOT use if you have an allergy to CHG or antibacterial soaps.  If your skin becomes reddened/irritated stop using the CHG and inform your nurse when you arrive at Short Stay.  You may shave your face/neck. Please follow these instructions carefully:  1.  Shower with CHG Soap the night before surgery and the  morning of Surgery.  2.  If you choose to wash your hair, wash your hair first as usual with your  normal  shampoo.  3.  After you shampoo, rinse your hair and body thoroughly to remove the  shampoo.                            4.  Use CHG as you would any other liquid soap.  You can apply chg directly  to the skin and wash                       Gently with a scrungie or clean washcloth.  5.  Apply the CHG Soap to your body ONLY FROM THE NECK DOWN.   Do not use on face/ open                           Wound or open sores. Avoid contact with eyes, ears mouth and genitals (private parts).                       Wash face,  Genitals (private parts) with your normal soap.             6.  Wash thoroughly, paying special attention to the area where your surgery  will be performed.  7.  Thoroughly rinse your body with warm water from the neck down.  8.  DO NOT shower/wash with your normal soap after using and rinsing off  the CHG Soap.                9.  Pat yourself dry with a clean towel.            10.  Wear clean pajamas.            11.  Place clean sheets on your bed the night of your first shower and do not  sleep with pets. Day of Surgery : Do not apply any lotions/deodorants the morning of surgery.  Please wear clean clothes to the hospital/surgery center.  FAILURE TO FOLLOW THESE INSTRUCTIONS MAY RESULT IN THE CANCELLATION OF YOUR  SURGERY    ________________________________________________________________________   Incentive Spirometer  An incentive spirometer is a tool that can help  keep your lungs clear and active. This tool measures how well you are filling your lungs with each breath. Taking long deep breaths may help reverse or decrease the chance of developing breathing (pulmonary) problems (especially infection) following: A long period of time when you are unable to move or be active. BEFORE THE PROCEDURE  If the spirometer includes an indicator to show your best effort, your nurse or respiratory therapist will set it to a desired goal. If possible, sit up straight or lean slightly forward. Try not to slouch. Hold the incentive spirometer in an upright position. INSTRUCTIONS FOR USE  Sit on the edge of your bed if possible, or sit up as far as you can in bed or on a chair. Hold the incentive spirometer in an upright position. Breathe out normally. Place the mouthpiece in your mouth and seal your lips tightly around it. Breathe in slowly and as deeply as possible, raising the piston or the ball toward the top of the column. Hold your breath for 3-5 seconds or for as long as possible. Allow the piston or ball to fall to the bottom of the column. Remove the mouthpiece from your mouth and breathe out normally. Rest for a few seconds and repeat Steps 1 through 7 at least 10 times every 1-2 hours when you are awake. Take your time and take a few normal breaths between deep breaths. The spirometer may include an indicator to show your best effort. Use the indicator as a goal to work toward during each repetition. After each set of 10 deep breaths, practice coughing to be sure your lungs are clear. If you have an incision (the cut made at the time of surgery), support your incision when coughing by placing a pillow or rolled up towels firmly against it. Once you are able to get out of bed, walk around indoors and  cough well. You may stop using the incentive spirometer when instructed by your caregiver.  RISKS AND COMPLICATIONS Take your time so you do not get dizzy or light-headed. If you are in pain, you may need to take or ask for pain medication before doing incentive spirometry. It is harder to take a deep breath if you are having pain. AFTER USE Rest and breathe slowly and easily. It can be helpful to keep track of a log of your progress. Your caregiver can provide you with a simple table to help with this. If you are using the spirometer at home, follow these instructions: [place] IF:  You are having difficultly using the spirometer. You have trouble using the spirometer as often as instructed. Your pain medication is not giving enough relief while using the spirometer. You develop fever of 100.5 F (38.1 C) or higher. SEEK IMMEDIATE MEDICAL CARE IF:  You cough up bloody sputum that had not been present before. You develop fever of 102 F (38.9 C) or greater. You develop worsening pain at or near the incision site. MAKE SURE YOU:  Understand these instructions. Will watch your condition. Will get help right away if you are not doing well or get worse. Document Released: 06/13/2006 Document Revised: 04/25/2011 Document Reviewed: 08/14/2006 [facility] Patient Information 2014 [place], [place].   ________________________________________________________________________

## 2021-09-29 ENCOUNTER — Ambulatory Visit: Payer: Self-pay | Admitting: Student

## 2021-09-29 DIAGNOSIS — E1165 Type 2 diabetes mellitus with hyperglycemia: Secondary | ICD-10-CM

## 2021-09-30 ENCOUNTER — Other Ambulatory Visit: Payer: Self-pay

## 2021-09-30 ENCOUNTER — Ambulatory Visit: Payer: Self-pay | Admitting: Student
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
## 2021-10-01 ENCOUNTER — Encounter (HOSPITAL_COMMUNITY): Payer: Self-pay

## 2021-10-01 ENCOUNTER — Encounter (HOSPITAL_COMMUNITY)
Admission: RE | Admit: 2021-10-01 | Discharge: 2021-10-01 | Disposition: A | Discharge status: Home / Self Care | Payer: Medicare Other | Source: Ambulatory Visit | Attending: Orthopedic Surgery | Admitting: Orthopedic Surgery

## 2021-10-01 ENCOUNTER — Other Ambulatory Visit: Payer: Self-pay

## 2021-10-01 DIAGNOSIS — Z01818 Encounter for other preprocedural examination: Secondary | ICD-10-CM | POA: Insufficient documentation

## 2021-10-01 DIAGNOSIS — Z794 Long term (current) use of insulin: Secondary | ICD-10-CM | POA: Diagnosis not present

## 2021-10-01 DIAGNOSIS — E1165 Type 2 diabetes mellitus with hyperglycemia: Secondary | ICD-10-CM | POA: Diagnosis not present

## 2021-10-01 HISTORY — DX: Gastro-esophageal reflux disease without esophagitis: K21.9

## 2021-10-01 HISTORY — DX: Umbilical hernia without obstruction or gangrene: K42.9

## 2021-10-01 LAB — BASIC METABOLIC PANEL
Anion gap: 3 — ABNORMAL LOW (ref 5–15)
BUN: 19 mg/dL (ref 8–23)
CO2: 30 mmol/L (ref 22–32)
Calcium: 9.4 mg/dL (ref 8.9–10.3)
Chloride: 107 mmol/L (ref 98–111)
Creatinine, Ser: 1.52 mg/dL — ABNORMAL HIGH (ref 0.61–1.24)
GFR, Estimated: 46 mL/min — ABNORMAL LOW (ref >60)
Glucose, Bld: 147 mg/dL — ABNORMAL HIGH (ref 70–99)
Potassium: 4.5 mmol/L (ref 3.5–5.1)
Sodium: 140 mmol/L (ref 135–145)

## 2021-10-01 LAB — CBC
HCT: 49.2 % (ref 39.0–52.0)
Hemoglobin: 15.4 g/dL (ref 13.0–17.0)
MCH: 29.3 pg (ref 26.0–34.0)
MCHC: 31.3 g/dL (ref 30.0–36.0)
MCV: 93.5 fL (ref 80.0–100.0)
Platelets: 153 10*3/uL (ref 150–400)
RBC: 5.26 MIL/uL (ref 4.22–5.81)
RDW: 14.2 % (ref 11.5–15.5)
WBC: 4.4 10*3/uL (ref 4.0–10.5)
nRBC: 0 % (ref 0.0–0.2)

## 2021-10-01 LAB — HEMOGLOBIN A1C
Hgb A1c MFr Bld: 6.7 % — ABNORMAL HIGH (ref 4.8–5.6)
Mean Plasma Glucose: 145.59 mg/dL

## 2021-10-01 LAB — SURGICAL PCR SCREEN
MRSA, PCR: NEGATIVE
Staphylococcus aureus: NEGATIVE

## 2021-10-01 LAB — GLUCOSE, CAPILLARY: Glucose-Capillary: 162 mg/dL — ABNORMAL HIGH (ref 70–99)

## 2021-10-01 NOTE — Progress Notes (Signed)
Anesthesia note:  Bowel prep reminder:NA  PCP - Jule Ser and Danne Baxter A Cardiologist [facility] Other-   Chest x-ray - no EKG - 10/01/21 Stress Test - no ECHO - Pt isn't sure where he had an ECHO maybe in [place] Cardiac Cath - no  Pacemaker/ICD device last checked:NA  Sleep Study - yes CPAP -no he can't breath with the mask on   Fasting Blood Sugar - 103-135 Checks Blood Sugar _QD____  Blood Thinner:Eliquis/ Dr at the [facility] Blood Thinner Instructions:stop 3 days prior to DOS/ Dr. Lyla Glassing Aspirin Instructions: Last Dose:10/09/21  Anesthesia review: yes  Patient denies shortness of breath, fever, cough and chest pain at PAT appointment Pt has been in a study for years in [place], [place]. He reports no SOB with his level of activity. He lost a lot of weight and no longer uses his C-Pap  Patient verbalized understanding of instructions that were given to them at the PAT appointment. Patient was also instructed that they will need to review over the PAT instructions again at home before surgery. yes

## 2021-10-12 NOTE — Anesthesia Preprocedure Evaluation (Signed)
Anesthesia Evaluation  Patient identified by MRN, date of birth, ID band Patient awake    Reviewed: Allergy & Precautions, NPO status , Patient's Chart, lab work & pertinent test results  Airway Mallampati: II  TM Distance: >3 FB Neck ROM: Full    Dental no notable dental hx. (+) Edentulous Upper, Missing,    Pulmonary sleep apnea , former smoker,    Pulmonary exam normal breath sounds clear to auscultation       Cardiovascular hypertension, Normal cardiovascular exam Rhythm:Regular Rate:Normal  10/01/21 EKG SB R 55 w nsst changes   Neuro/Psych    GI/Hepatic GERD  ,  Endo/Other  diabetes, Type 2  Renal/GU Renal InsufficiencyRenal diseaseLab Results      Component                Value               Date                      CREATININE               1.52 (H)            10/01/2021              K                        4.5                 10/01/2021                   Musculoskeletal  (+) Arthritis , Osteoarthritis,    Abdominal   Peds  Hematology Lab Results      Component                Value               Date                          HGB                      15.4                10/01/2021                HCT                      49.2                10/01/2021                PLT                      153                 10/01/2021              Anesthesia Other Findings ALL:PCN, Prevastatin  Reproductive/Obstetrics                            Anesthesia Physical Anesthesia Plan  ASA: 3  Anesthesia Plan: Spinal and Regional   Post-op Pain Management: Regional block* and Minimal or no pain anticipated   Induction:   PONV Risk Score and Plan: TIVA and Treatment may vary due to age or medical  condition  Airway Management Planned: Nasal Cannula, Natural Airway and Simple Face Mask  Additional Equipment: None  Intra-op Plan:   Post-operative Plan:   Informed Consent: I have reviewed the  patients History and Physical, chart, labs and discussed the procedure including the risks, benefits and alternatives for the proposed anesthesia with the patient or authorized representative who has indicated his/her understanding and acceptance.     Dental advisory given  Plan Discussed with:   Anesthesia Plan Comments: (Spinal + L Adductor canal  Pt on semaglutide last took last Wednesday  Pt stopped eliquis on 8/27 @ 0500  )     Anesthesia Quick Evaluation

## 2021-10-13 ENCOUNTER — Other Ambulatory Visit: Payer: Self-pay

## 2021-10-13 ENCOUNTER — Ambulatory Visit (HOSPITAL_COMMUNITY): Payer: Medicare Other

## 2021-10-13 ENCOUNTER — Encounter (HOSPITAL_COMMUNITY)
Admission: RE | Disposition: A | Discharge status: Home / Self Care | Payer: Self-pay | Source: Home / Self Care | Attending: Orthopedic Surgery

## 2021-10-13 ENCOUNTER — Ambulatory Visit (HOSPITAL_COMMUNITY): Payer: Medicare Other | Admitting: Physician Assistant

## 2021-10-13 ENCOUNTER — Encounter (HOSPITAL_COMMUNITY): Payer: Self-pay | Admitting: Orthopedic Surgery

## 2021-10-13 ENCOUNTER — Ambulatory Visit (HOSPITAL_BASED_OUTPATIENT_CLINIC_OR_DEPARTMENT_OTHER): Payer: Medicare Other | Admitting: Anesthesiology

## 2021-10-13 ENCOUNTER — Ambulatory Visit (HOSPITAL_COMMUNITY)
Admission: RE | Admit: 2021-10-13 | Discharge: 2021-10-14 | Disposition: A | Discharge status: Home / Self Care | Payer: Medicare Other | Attending: Orthopedic Surgery | Admitting: Orthopedic Surgery

## 2021-10-13 DIAGNOSIS — Z7984 Long term (current) use of oral hypoglycemic drugs: Secondary | ICD-10-CM

## 2021-10-13 DIAGNOSIS — X58XXXS Exposure to other specified factors, sequela: Secondary | ICD-10-CM | POA: Diagnosis not present

## 2021-10-13 DIAGNOSIS — E119 Type 2 diabetes mellitus without complications: Secondary | ICD-10-CM

## 2021-10-13 DIAGNOSIS — E1165 Type 2 diabetes mellitus with hyperglycemia: Secondary | ICD-10-CM | POA: Diagnosis not present

## 2021-10-13 DIAGNOSIS — K219 Gastro-esophageal reflux disease without esophagitis: Secondary | ICD-10-CM | POA: Diagnosis not present

## 2021-10-13 DIAGNOSIS — E1140 Type 2 diabetes mellitus with diabetic neuropathy, unspecified: Secondary | ICD-10-CM | POA: Insufficient documentation

## 2021-10-13 DIAGNOSIS — M1732 Unilateral post-traumatic osteoarthritis, left knee: Secondary | ICD-10-CM | POA: Diagnosis present

## 2021-10-13 DIAGNOSIS — G4730 Sleep apnea, unspecified: Secondary | ICD-10-CM | POA: Diagnosis not present

## 2021-10-13 DIAGNOSIS — N183 Chronic kidney disease, stage 3 unspecified: Secondary | ICD-10-CM | POA: Diagnosis not present

## 2021-10-13 DIAGNOSIS — Z87891 Personal history of nicotine dependence: Secondary | ICD-10-CM | POA: Diagnosis not present

## 2021-10-13 DIAGNOSIS — Z7985 Long-term (current) use of injectable non-insulin antidiabetic drugs: Secondary | ICD-10-CM | POA: Diagnosis not present

## 2021-10-13 DIAGNOSIS — T1490XS Injury, unspecified, sequela: Secondary | ICD-10-CM | POA: Insufficient documentation

## 2021-10-13 DIAGNOSIS — M1712 Unilateral primary osteoarthritis, left knee: Secondary | ICD-10-CM | POA: Diagnosis not present

## 2021-10-13 DIAGNOSIS — E1122 Type 2 diabetes mellitus with diabetic chronic kidney disease: Secondary | ICD-10-CM | POA: Insufficient documentation

## 2021-10-13 DIAGNOSIS — Z96652 Presence of left artificial knee joint: Secondary | ICD-10-CM | POA: Diagnosis not present

## 2021-10-13 DIAGNOSIS — I10 Essential (primary) hypertension: Secondary | ICD-10-CM | POA: Diagnosis not present

## 2021-10-13 DIAGNOSIS — G4733 Obstructive sleep apnea (adult) (pediatric): Secondary | ICD-10-CM | POA: Diagnosis not present

## 2021-10-13 DIAGNOSIS — Z01818 Encounter for other preprocedural examination: Secondary | ICD-10-CM

## 2021-10-13 DIAGNOSIS — I129 Hypertensive chronic kidney disease with stage 1 through stage 4 chronic kidney disease, or unspecified chronic kidney disease: Secondary | ICD-10-CM | POA: Insufficient documentation

## 2021-10-13 DIAGNOSIS — Z471 Aftercare following joint replacement surgery: Secondary | ICD-10-CM | POA: Diagnosis not present

## 2021-10-13 DIAGNOSIS — G8918 Other acute postprocedural pain: Secondary | ICD-10-CM | POA: Diagnosis not present

## 2021-10-13 HISTORY — PX: KNEE ARTHROPLASTY: SHX992

## 2021-10-13 HISTORY — DX: Unilateral post-traumatic osteoarthritis, left knee: M17.32

## 2021-10-13 LAB — GLUCOSE, CAPILLARY
Glucose-Capillary: 141 mg/dL — ABNORMAL HIGH (ref 70–99)
Glucose-Capillary: 163 mg/dL — ABNORMAL HIGH (ref 70–99)
Glucose-Capillary: 155 mg/dL — ABNORMAL HIGH (ref 70–99)
Glucose-Capillary: 247 mg/dL — ABNORMAL HIGH (ref 70–99)

## 2021-10-13 SURGERY — ARTHROPLASTY, KNEE, TOTAL, USING IMAGELESS COMPUTER-ASSISTED NAVIGATION
Anesthesia: Regional | Site: Knee | Laterality: Left

## 2021-10-13 MED ORDER — DEXAMETHASONE SODIUM PHOSPHATE 10 MG/ML IJ SOLN
INTRAMUSCULAR | Status: AC
  Filled 2021-10-13: qty 1

## 2021-10-13 MED ORDER — BUPIVACAINE-EPINEPHRINE 0.25% -1:200000 IJ SOLN
INTRAMUSCULAR | Status: DC | PRN
  Administered 2021-10-13: 30 mL

## 2021-10-13 MED ORDER — ALLOPURINOL 100 MG PO TABS
100.0000 mg | ORAL_TABLET | Freq: Every day | ORAL | Status: DC
  Administered 2021-10-14: 100 mg via ORAL
  Filled 2021-10-13: qty 1

## 2021-10-13 MED ORDER — EPHEDRINE SULFATE (PRESSORS) 50 MG/ML IJ SOLN
INTRAMUSCULAR | Status: DC | PRN
  Administered 2021-10-13: 10 mg via INTRAVENOUS

## 2021-10-13 MED ORDER — FINERENONE 10 MG PO TABS: 10.0000 mg | ORAL_TABLET | Freq: Every day | ORAL | Status: DC

## 2021-10-13 MED ORDER — METOPROLOL TARTRATE 25 MG PO TABS
25.0000 mg | ORAL_TABLET | Freq: Two times a day (BID) | ORAL | Status: DC
  Administered 2021-10-13 – 2021-10-14 (×2): 25 mg via ORAL
  Filled 2021-10-13 (×2): qty 1

## 2021-10-13 MED ORDER — CEFAZOLIN SODIUM-DEXTROSE 2-4 GM/100ML-% IV SOLN
2.0000 g | Freq: Four times a day (QID) | INTRAVENOUS | Status: AC
  Administered 2021-10-13 (×2): 2 g via INTRAVENOUS
  Filled 2021-10-13 (×2): qty 100

## 2021-10-13 MED ORDER — INSULIN ASPART 100 UNIT/ML IJ SOLN
0.0000 [IU] | Freq: Three times a day (TID) | INTRAMUSCULAR | Status: DC
  Administered 2021-10-13 – 2021-10-14 (×2): 2 [IU] via SUBCUTANEOUS

## 2021-10-13 MED ORDER — ACETAMINOPHEN 500 MG PO TABS
1000.0000 mg | ORAL_TABLET | Freq: Once | ORAL | Status: AC
  Administered 2021-10-13: 1000 mg via ORAL
  Filled 2021-10-13: qty 2

## 2021-10-13 MED ORDER — PROPOFOL 500 MG/50ML IV EMUL
INTRAVENOUS | Status: DC | PRN
  Administered 2021-10-13: 50 ug/kg/min via INTRAVENOUS

## 2021-10-13 MED ORDER — KETOROLAC TROMETHAMINE 30 MG/ML IJ SOLN
INTRAMUSCULAR | Status: DC | PRN
  Administered 2021-10-13: 30 mg

## 2021-10-13 MED ORDER — DIPHENHYDRAMINE HCL 12.5 MG/5ML PO ELIX: 12.5000 mg | ORAL_SOLUTION | ORAL | Status: DC | PRN

## 2021-10-13 MED ORDER — AMLODIPINE BESYLATE 10 MG PO TABS
10.0000 mg | ORAL_TABLET | Freq: Every day | ORAL | Status: DC
  Administered 2021-10-14: 10 mg via ORAL
  Filled 2021-10-13: qty 1

## 2021-10-13 MED ORDER — CHLORHEXIDINE GLUCONATE 0.12 % MT SOLN
15.0000 mL | Freq: Once | OROMUCOSAL | Status: AC
  Administered 2021-10-13: 15 mL via OROMUCOSAL

## 2021-10-13 MED ORDER — ONDANSETRON HCL 4 MG/2ML IJ SOLN
INTRAMUSCULAR | Status: DC | PRN
  Administered 2021-10-13: 4 mg via INTRAVENOUS

## 2021-10-13 MED ORDER — METHOCARBAMOL 500 MG PO TABS
500.0000 mg | ORAL_TABLET | Freq: Four times a day (QID) | ORAL | Status: DC | PRN
  Administered 2021-10-13: 500 mg via ORAL
  Filled 2021-10-13: qty 1

## 2021-10-13 MED ORDER — SODIUM CHLORIDE 0.9 % IR SOLN
Status: DC | PRN
  Administered 2021-10-13 (×2): 1000 mL

## 2021-10-13 MED ORDER — ACETAMINOPHEN 325 MG PO TABS: 325.0000 mg | ORAL_TABLET | Freq: Four times a day (QID) | ORAL | Status: DC | PRN

## 2021-10-13 MED ORDER — ONDANSETRON HCL 4 MG/2ML IJ SOLN: 4.0000 mg | Freq: Four times a day (QID) | INTRAMUSCULAR | Status: DC | PRN

## 2021-10-13 MED ORDER — GABAPENTIN 300 MG PO CAPS
300.0000 mg | ORAL_CAPSULE | Freq: Three times a day (TID) | ORAL | Status: DC
  Administered 2021-10-13 – 2021-10-14 (×3): 300 mg via ORAL
  Filled 2021-10-13 (×3): qty 1

## 2021-10-13 MED ORDER — ONDANSETRON HCL 4 MG/2ML IJ SOLN
INTRAMUSCULAR | Status: AC
Start: 2021-10-13 — End: ?
  Filled 2021-10-13: qty 2

## 2021-10-13 MED ORDER — DOCUSATE SODIUM 100 MG PO CAPS
100.0000 mg | ORAL_CAPSULE | Freq: Two times a day (BID) | ORAL | Status: DC
  Administered 2021-10-13 – 2021-10-14 (×3): 100 mg via ORAL
  Filled 2021-10-13 (×3): qty 1

## 2021-10-13 MED ORDER — METHOCARBAMOL 500 MG IVPB - SIMPLE MED: 500.0000 mg | Freq: Four times a day (QID) | INTRAVENOUS | Status: DC | PRN

## 2021-10-13 MED ORDER — PANTOPRAZOLE SODIUM 40 MG PO TBEC
40.0000 mg | DELAYED_RELEASE_TABLET | Freq: Every day | ORAL | Status: DC
  Administered 2021-10-14: 40 mg via ORAL
  Filled 2021-10-13: qty 1

## 2021-10-13 MED ORDER — ONDANSETRON HCL 4 MG/2ML IJ SOLN: 4.0000 mg | Freq: Once | INTRAMUSCULAR | Status: DC | PRN

## 2021-10-13 MED ORDER — SODIUM CHLORIDE (PF) 0.9 % IJ SOLN
INTRAMUSCULAR | Status: AC
  Filled 2021-10-13: qty 50

## 2021-10-13 MED ORDER — COLCHICINE 0.6 MG PO TABS: 0.6000 mg | ORAL_TABLET | Freq: Every day | ORAL | Status: DC | PRN

## 2021-10-13 MED ORDER — PHENOL 1.4 % MT LIQD: 1.0000 | OROMUCOSAL | Status: DC | PRN

## 2021-10-13 MED ORDER — APIXABAN 2.5 MG PO TABS
2.5000 mg | ORAL_TABLET | Freq: Two times a day (BID) | ORAL | Status: DC
  Administered 2021-10-14: 2.5 mg via ORAL
  Filled 2021-10-13: qty 1

## 2021-10-13 MED ORDER — MENTHOL 3 MG MT LOZG: 1.0000 | LOZENGE | OROMUCOSAL | Status: DC | PRN

## 2021-10-13 MED ORDER — FENTANYL CITRATE PF 50 MCG/ML IJ SOSY
50.0000 ug | PREFILLED_SYRINGE | INTRAMUSCULAR | Status: DC
  Administered 2021-10-13: 50 ug via INTRAVENOUS
  Filled 2021-10-13: qty 2

## 2021-10-13 MED ORDER — DEXAMETHASONE SODIUM PHOSPHATE 10 MG/ML IJ SOLN
INTRAMUSCULAR | Status: DC | PRN
  Administered 2021-10-13: 8 mg via INTRAVENOUS

## 2021-10-13 MED ORDER — BUPIVACAINE-EPINEPHRINE (PF) 0.25% -1:200000 IJ SOLN
INTRAMUSCULAR | Status: AC
  Filled 2021-10-13: qty 30

## 2021-10-13 MED ORDER — SODIUM CHLORIDE (PF) 0.9 % IJ SOLN
INTRAMUSCULAR | Status: DC | PRN
  Administered 2021-10-13: 30 mL

## 2021-10-13 MED ORDER — OXYCODONE HCL 5 MG PO TABS: 5.0000 mg | ORAL_TABLET | ORAL | Status: DC | PRN

## 2021-10-13 MED ORDER — POLYVINYL ALCOHOL 1.4 % OP SOLN
1.0000 [drp] | OPHTHALMIC | Status: DC | PRN
  Filled 2021-10-13: qty 15

## 2021-10-13 MED ORDER — OXYCODONE HCL 5 MG PO TABS
10.0000 mg | ORAL_TABLET | ORAL | Status: DC | PRN
  Administered 2021-10-14: 10 mg via ORAL
  Filled 2021-10-13: qty 2

## 2021-10-13 MED ORDER — DEXMEDETOMIDINE HCL IN NACL 80 MCG/20ML IV SOLN
INTRAVENOUS | Status: AC
  Filled 2021-10-13: qty 20

## 2021-10-13 MED ORDER — FESOTERODINE FUMARATE ER 4 MG PO TB24
4.0000 mg | ORAL_TABLET | Freq: Every day | ORAL | Status: DC
  Administered 2021-10-13 – 2021-10-14 (×2): 4 mg via ORAL
  Filled 2021-10-13 (×2): qty 1

## 2021-10-13 MED ORDER — SODIUM CHLORIDE 0.9 % IV SOLN: INTRAVENOUS | Status: DC

## 2021-10-13 MED ORDER — SENNA 8.6 MG PO TABS
1.0000 | ORAL_TABLET | Freq: Two times a day (BID) | ORAL | Status: DC
Start: 2021-10-13 — End: 2021-10-14
  Administered 2021-10-13 – 2021-10-14 (×2): 8.6 mg via ORAL
  Filled 2021-10-13 (×2): qty 1

## 2021-10-13 MED ORDER — ISOPROPYL ALCOHOL 70 % SOLN
Status: DC | PRN
  Administered 2021-10-13: 1 via TOPICAL

## 2021-10-13 MED ORDER — LACTATED RINGERS IV SOLN: INTRAVENOUS | Status: DC

## 2021-10-13 MED ORDER — ONDANSETRON HCL 4 MG PO TABS: 4.0000 mg | ORAL_TABLET | Freq: Four times a day (QID) | ORAL | Status: DC | PRN

## 2021-10-13 MED ORDER — PROPOFOL 500 MG/50ML IV EMUL
INTRAVENOUS | Status: AC
  Filled 2021-10-13: qty 50

## 2021-10-13 MED ORDER — TAMSULOSIN HCL 0.4 MG PO CAPS
0.4000 mg | ORAL_CAPSULE | Freq: Every day | ORAL | Status: DC
  Administered 2021-10-14: 0.4 mg via ORAL
  Filled 2021-10-13: qty 1

## 2021-10-13 MED ORDER — ACETAMINOPHEN 500 MG PO TABS
1000.0000 mg | ORAL_TABLET | Freq: Four times a day (QID) | ORAL | Status: DC
  Administered 2021-10-13 – 2021-10-14 (×3): 1000 mg via ORAL
  Filled 2021-10-13 (×3): qty 2

## 2021-10-13 MED ORDER — DEXMEDETOMIDINE (PRECEDEX) IN NS 20 MCG/5ML (4 MCG/ML) IV SYRINGE
PREFILLED_SYRINGE | INTRAVENOUS | Status: DC | PRN
  Administered 2021-10-13: 4 ug via INTRAVENOUS
  Administered 2021-10-13: 8 ug via INTRAVENOUS

## 2021-10-13 MED ORDER — METOCLOPRAMIDE HCL 5 MG PO TABS: 5.0000 mg | ORAL_TABLET | Freq: Three times a day (TID) | ORAL | Status: DC | PRN

## 2021-10-13 MED ORDER — POVIDONE-IODINE 10 % EX SWAB
2.0000 | Freq: Once | CUTANEOUS | Status: AC
  Administered 2021-10-13: 2 via TOPICAL

## 2021-10-13 MED ORDER — HYDRALAZINE HCL 50 MG PO TABS
50.0000 mg | ORAL_TABLET | Freq: Two times a day (BID) | ORAL | Status: DC
  Administered 2021-10-13 – 2021-10-14 (×2): 50 mg via ORAL
  Filled 2021-10-13 (×2): qty 1

## 2021-10-13 MED ORDER — POLYETHYLENE GLYCOL 3350 17 G PO PACK: 17.0000 g | PACK | Freq: Every day | ORAL | Status: DC | PRN

## 2021-10-13 MED ORDER — CLONIDINE HCL (ANALGESIA) 100 MCG/ML EP SOLN
EPIDURAL | Status: DC | PRN
  Administered 2021-10-13: 100 ug

## 2021-10-13 MED ORDER — KETOROLAC TROMETHAMINE 30 MG/ML IJ SOLN
INTRAMUSCULAR | Status: AC
  Filled 2021-10-13: qty 1

## 2021-10-13 MED ORDER — ROPIVACAINE HCL 5 MG/ML IJ SOLN
INTRAMUSCULAR | Status: DC | PRN
  Administered 2021-10-13: 30 mL via PERINEURAL

## 2021-10-13 MED ORDER — ALUM & MAG HYDROXIDE-SIMETH 200-200-20 MG/5ML PO SUSP: 30.0000 mL | ORAL | Status: DC | PRN

## 2021-10-13 MED ORDER — METOCLOPRAMIDE HCL 5 MG/ML IJ SOLN: 5.0000 mg | Freq: Three times a day (TID) | INTRAMUSCULAR | Status: DC | PRN

## 2021-10-13 MED ORDER — FUROSEMIDE 20 MG PO TABS
20.0000 mg | ORAL_TABLET | Freq: Every day | ORAL | Status: DC
  Administered 2021-10-14: 20 mg via ORAL
  Filled 2021-10-13: qty 1

## 2021-10-13 MED ORDER — FINASTERIDE 5 MG PO TABS
5.0000 mg | ORAL_TABLET | Freq: Every day | ORAL | Status: DC
  Administered 2021-10-14: 5 mg via ORAL
  Filled 2021-10-13: qty 1

## 2021-10-13 MED ORDER — ORAL CARE MOUTH RINSE: 15.0000 mL | OROMUCOSAL | Status: DC | PRN

## 2021-10-13 MED ORDER — TRANEXAMIC ACID-NACL 1000-0.7 MG/100ML-% IV SOLN
1000.0000 mg | INTRAVENOUS | Status: AC
  Administered 2021-10-13: 1000 mg via INTRAVENOUS
  Filled 2021-10-13: qty 100

## 2021-10-13 MED ORDER — HYDROMORPHONE HCL 1 MG/ML IJ SOLN: 0.5000 mg | INTRAMUSCULAR | Status: DC | PRN

## 2021-10-13 MED ORDER — MIDAZOLAM HCL 2 MG/2ML IJ SOLN
INTRAMUSCULAR | Status: AC
  Filled 2021-10-13: qty 2

## 2021-10-13 MED ORDER — ACETAMINOPHEN 10 MG/ML IV SOLN: 1000.0000 mg | Freq: Once | INTRAVENOUS | Status: DC | PRN

## 2021-10-13 MED ORDER — FENTANYL CITRATE PF 50 MCG/ML IJ SOSY: 25.0000 ug | PREFILLED_SYRINGE | INTRAMUSCULAR | Status: DC | PRN

## 2021-10-13 MED ORDER — BUPIVACAINE IN DEXTROSE 0.75-8.25 % IT SOLN
INTRATHECAL | Status: DC | PRN
  Administered 2021-10-13: 12 mg via INTRATHECAL

## 2021-10-13 MED ORDER — POVIDONE-IODINE 10 % EX SWAB: 2.0000 | Freq: Once | CUTANEOUS | Status: DC

## 2021-10-13 MED ORDER — ORAL CARE MOUTH RINSE: 15.0000 mL | Freq: Once | OROMUCOSAL | Status: AC

## 2021-10-13 MED ORDER — CEFAZOLIN SODIUM-DEXTROSE 2-4 GM/100ML-% IV SOLN
2.0000 g | INTRAVENOUS | Status: AC
  Administered 2021-10-13: 2 g via INTRAVENOUS
  Filled 2021-10-13: qty 100

## 2021-10-13 MED ORDER — PROPOFOL 1000 MG/100ML IV EMUL
INTRAVENOUS | Status: AC
  Filled 2021-10-13: qty 100

## 2021-10-13 MED ORDER — PHENYLEPHRINE HCL-NACL 20-0.9 MG/250ML-% IV SOLN
INTRAVENOUS | Status: DC | PRN
  Administered 2021-10-13: 160 ug via INTRAVENOUS
  Administered 2021-10-13: 50 ug/min via INTRAVENOUS

## 2021-10-13 SURGICAL SUPPLY — 67 items
BAG COUNTER SPONGE SURGICOUNT (BAG) IMPLANT
BAG ZIPLOCK 12X15 (MISCELLANEOUS) IMPLANT
BATTERY INSTRU NAVIGATION (MISCELLANEOUS) ×3 IMPLANT
BLADE SAW RECIPROCATING 77.5 (BLADE) ×1 IMPLANT
BNDG ELASTIC 4X5.8 VLCR STR LF (GAUZE/BANDAGES/DRESSINGS) ×1 IMPLANT
BNDG ELASTIC 6X5.8 VLCR STR LF (GAUZE/BANDAGES/DRESSINGS) ×1 IMPLANT
CHLORAPREP W/TINT 26 (MISCELLANEOUS) ×2 IMPLANT
COMP FEM PS KNEE STD 10 LT (Knees) ×1 IMPLANT
COMP TIB PS G 0D LT (Joint) ×1 IMPLANT
COMPONENT FEM PS KN STD 10 LT (Knees) IMPLANT
COMPONET TIB PS G 0D LT (Joint) IMPLANT
COVER SURGICAL LIGHT HANDLE (MISCELLANEOUS) ×1 IMPLANT
DERMABOND ADVANCED (GAUZE/BANDAGES/DRESSINGS) ×1
DERMABOND ADVANCED .7 DNX12 (GAUZE/BANDAGES/DRESSINGS) ×2 IMPLANT
DRAPE INCISE IOBAN 66X45 STRL (DRAPES) ×1 IMPLANT
DRAPE SHEET LG 3/4 BI-LAMINATE (DRAPES) ×3 IMPLANT
DRAPE U-SHAPE 47X51 STRL (DRAPES) ×1 IMPLANT
DRSG AQUACEL AG ADV 3.5X10 (GAUZE/BANDAGES/DRESSINGS) ×1 IMPLANT
ELECT BLADE TIP CTD 4 INCH (ELECTRODE) ×1 IMPLANT
ELECT REM PT RETURN 15FT ADLT (MISCELLANEOUS) ×1 IMPLANT
GAUZE SPONGE 4X4 12PLY STRL (GAUZE/BANDAGES/DRESSINGS) ×1 IMPLANT
GLOVE BIO SURGEON STRL SZ7 (GLOVE) ×1 IMPLANT
GLOVE BIO SURGEON STRL SZ8.5 (GLOVE) ×2 IMPLANT
GLOVE BIOGEL PI IND STRL 7.5 (GLOVE) ×1 IMPLANT
GLOVE BIOGEL PI IND STRL 8.5 (GLOVE) ×1 IMPLANT
GLOVE BIOGEL PI INDICATOR 7.5 (GLOVE) ×1
GLOVE BIOGEL PI INDICATOR 8.5 (GLOVE) ×1
GOWN SPEC L3 XXLG W/TWL (GOWN DISPOSABLE) ×1 IMPLANT
GOWN STRL REUS W/ TWL XL LVL3 (GOWN DISPOSABLE) ×1 IMPLANT
GOWN STRL REUS W/TWL XL LVL3 (GOWN DISPOSABLE) ×1
HANDPIECE INTERPULSE COAX TIP (DISPOSABLE) ×1
HDLS TROCR DRIL PIN KNEE 75 (PIN) ×1
HOLDER FOLEY CATH W/STRAP (MISCELLANEOUS) ×1 IMPLANT
HOOD PEEL AWAY FLYTE STAYCOOL (MISCELLANEOUS) ×3 IMPLANT
KIT TURNOVER KIT A (KITS) IMPLANT
LINER TIB ASF PS GH/7-12 10 LT (Liner) IMPLANT
MARKER SKIN DUAL TIP RULER LAB (MISCELLANEOUS) ×1 IMPLANT
NDL SAFETY ECLIP 18X1.5 (MISCELLANEOUS) ×1 IMPLANT
NDL SPNL 18GX3.5 QUINCKE PK (NEEDLE) ×1 IMPLANT
NEEDLE SPNL 18GX3.5 QUINCKE PK (NEEDLE) ×1 IMPLANT
NS IRRIG 1000ML POUR BTL (IV SOLUTION) ×1 IMPLANT
PACK TOTAL KNEE CUSTOM (KITS) ×1 IMPLANT
PADDING CAST COTTON 6X4 STRL (CAST SUPPLIES) ×1 IMPLANT
PATELLA STD SZ 38X10 (Miscellaneous) IMPLANT
PIN DRILL HDLS TROCAR 75 4PK (PIN) IMPLANT
PROTECTOR NERVE ULNAR (MISCELLANEOUS) ×1 IMPLANT
SAW OSC TIP CART 19.5X105X1.3 (SAW) ×1 IMPLANT
SCREW FEMALE HEX FIX 25X2.5 (ORTHOPEDIC DISPOSABLE SUPPLIES) IMPLANT
SEALER BIPOLAR AQUA 6.0 (INSTRUMENTS) ×1 IMPLANT
SET HNDPC FAN SPRY TIP SCT (DISPOSABLE) ×1 IMPLANT
SET PAD KNEE POSITIONER (MISCELLANEOUS) ×1 IMPLANT
SOLUTION PRONTOSAN WOUND 350ML (IRRIGATION / IRRIGATOR) IMPLANT
SPIKE FLUID TRANSFER (MISCELLANEOUS) ×2 IMPLANT
SUT MNCRL AB 3-0 PS2 18 (SUTURE) ×1 IMPLANT
SUT MNCRL AB 4-0 PS2 18 (SUTURE) ×1 IMPLANT
SUT MON AB 2-0 CT1 36 (SUTURE) ×1 IMPLANT
SUT STRATAFIX PDO 1 14 VIOLET (SUTURE) ×1
SUT STRATFX PDO 1 14 VIOLET (SUTURE) ×1
SUT VIC AB 1 CTX 36 (SUTURE) ×2
SUT VIC AB 1 CTX36XBRD ANBCTR (SUTURE) ×2 IMPLANT
SUT VIC AB 2-0 CT1 27 (SUTURE) ×1
SUT VIC AB 2-0 CT1 TAPERPNT 27 (SUTURE) ×1 IMPLANT
SUTURE STRATFX PDO 1 14 VIOLET (SUTURE) ×1 IMPLANT
TRAY FOLEY MTR SLVR 16FR STAT (SET/KITS/TRAYS/PACK) IMPLANT
TUBE SUCTION HIGH CAP CLEAR NV (SUCTIONS) ×1 IMPLANT
WATER STERILE IRR 1000ML POUR (IV SOLUTION) ×2 IMPLANT
WRAP KNEE MAXI GEL POST OP (GAUZE/BANDAGES/DRESSINGS) IMPLANT

## 2021-10-13 NOTE — Care Plan (Signed)
Ortho Bundle Case Management Note  Patient Details  Name: Eric Moody Sr. MRN: [id number] Date of Birth: 1940-09-08  L TKA on 10-13-21 DCP:  Home with dtr DME:  RW ordered through [facility] PT:  Pro[facility] on 10-15-21                   DME Arranged:  [facility] rolling DME Agency:  [employer]  HH Arranged:  NA [place] Agency:  NA  Additional Comments: Please contact me with any questions of if this plan should need to change.  Marianne Sofia, RN,CCM [facility]  [phone] 10/13/2021, 10:21 AM

## 2021-10-13 NOTE — Transfer of Care (Signed)
Immediate Anesthesia Transfer of Care Note  Patient: Eric Hammed Sr.  Procedure(s) Performed: COMPUTER ASSISTED TOTAL KNEE ARTHROPLASTY (Left: Knee)  Patient Location: PACU  Anesthesia Type:Spinal  Level of Consciousness: awake and sedated  Airway & Oxygen Therapy: Patient Spontanous Breathing  Post-op Assessment: Report given to RN  Post vital signs: stable  Last Vitals:  Vitals Value Taken Time  BP 109/61 10/13/21 1350  Temp    Pulse 62 10/13/21 1354  Resp 14 10/13/21 1354  SpO2 95 % 10/13/21 1354  Vitals shown include unvalidated device data.  Last Pain:  Vitals:   10/13/21 0745  TempSrc: Oral         Complications: No notable events documented.
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
## 2021-10-13 NOTE — Anesthesia Procedure Notes (Signed)
Spinal  Patient location during procedure: OR Start time: 10/13/2021 10:41 AM End time: 10/13/2021 10:47 AM Reason for block: surgical anesthesia Staffing Performed: anesthesiologist  Anesthesiologist: Barnet Glasgow, MD Performed by: Barnet Glasgow, MD Authorized by: Barnet Glasgow, MD   Preanesthetic Checklist Completed: patient identified, IV checked, risks and benefits discussed, surgical consent, monitors and equipment checked, pre-op evaluation and timeout performed Spinal Block Patient position: sitting Prep: DuraPrep and site prepped and draped Patient monitoring: heart rate, cardiac monitor, continuous pulse ox and blood pressure Approach: midline Location: L3-4 Injection technique: single-shot Needle Needle type: Pencan  Needle gauge: 24 G Needle length: 10 cm Needle insertion depth: 7 cm Assessment Sensory level: T4 Events: CSF return Additional Notes  1 Attempt L 3-4, 1 attempt L 4-5 (s). Pt tolerated procedure well.

## 2021-10-13 NOTE — Evaluation (Signed)
Physical Therapy Evaluation Patient Details Name: Eric Mcclain Sr. MRN: [id number] DOB: 1940-04-24 Today's Date: 10/13/2021  History of Present Illness  Pt is an 81yo male presenting s/p L-TKA on 10/13/21. PMH: DM with neuropathy, afib, CKD, GERD, gout, HLD, HTN, bilateral carpal tunnel release 2010. L foot fusion 2018.  Clinical Impression  Eric Tetrick Sr. is a 81 y.o. male POD 0 s/p L-TKA. Patient reports using SPC for mobility at baseline. Patient is now limited by functional impairments (see PT problem list below) and requires supervision for bed mobility and min guard for transfers. Patient was able to ambulate 20 feet with RW and min guard level of assist. Patient instructed in exercise to facilitate ROM and circulation to manage edema. Provided incentive spirometer and with Vcs pt able to achieve 1741m. Patient will benefit from continued skilled PT interventions to address impairments and progress towards PLOF. Acute PT will follow to progress mobility and stair training in preparation for safe discharge home.       Recommendations for follow up therapy are one component of a multi-disciplinary discharge planning process, led by the attending physician.  Recommendations may be updated based on patient status, additional functional criteria and insurance authorization.  Follow Up Recommendations Follow physician's recommendations for discharge plan and follow up therapies      Assistance Recommended at Discharge Set up Supervision/Assistance  Patient can return home with the following  A little help with walking and/or transfers;A little help with bathing/dressing/bathroom;Assistance with cooking/housework;Assist for transportation;Help with stairs or ramp for entrance    Equipment Recommendations Rolling walker (2 wheels)  Recommendations for Other Services       Functional Status Assessment Patient has had a recent decline in their functional status and demonstrates the ability to  make significant improvements in function in a reasonable and predictable amount of time.     Precautions / Restrictions Precautions Precautions: Knee Precaution Booklet Issued: No Precaution Comments: no pillow under knee Restrictions Weight Bearing Restrictions: No Other Position/Activity Restrictions: wbat      Mobility  Bed Mobility Overal bed mobility: Needs Assistance Bed Mobility: Supine to Sit     Supine to sit: Supervision     General bed mobility comments: For safety only    Transfers Overall transfer level: Needs assistance Equipment used: Rolling walker (2 wheels) Transfers: Sit to/from Stand Sit to Stand: Min guard, From elevated surface           General transfer comment: for safety only.    Ambulation/Gait Ambulation/Gait assistance: Min guard, +2 safety/equipment Gait Distance (Feet): 20 Feet Assistive device: Rolling walker (2 wheels) Gait Pattern/deviations: Step-to pattern Gait velocity: decreased     General Gait Details: Pt ambulated with RW and min guard, +2 for recliner follow, no physical assist required or overt LOB noted.  Stairs            Wheelchair Mobility    Modified Rankin (Stroke Patients Only)       Balance Overall balance assessment: Needs assistance Sitting-balance support: Feet supported, No upper extremity supported Sitting balance-Leahy Scale: Good     Standing balance support: Reliant on assistive device for balance, During functional activity, Bilateral upper extremity supported Standing balance-Leahy Scale: Poor                               Pertinent Vitals/Pain Pain Assessment Pain Assessment: 0-10 Pain Score: 5  Pain Location: left knee Pain Descriptors / Indicators: Operative  site guarding    Home Living Family/patient expects to be discharged to:: Private residence Living Arrangements: Alone Available Help at Discharge: Available PRN/intermittently;Family ("dropping in and  out") Type of Home: House Home Access: Stairs to enter Entrance Stairs-Rails: Left Entrance Stairs-Number of Steps: 4   Home Layout: One level Home Equipment: Cane - single point      Prior Function Prior Level of Function : Independent/Modified Independent             Mobility Comments: SPC ADLs Comments: IND     Hand Dominance        Extremity/Trunk Assessment   Upper Extremity Assessment Upper Extremity Assessment: Overall WFL for tasks assessed    Lower Extremity Assessment Lower Extremity Assessment: RLE deficits/detail;LLE deficits/detail RLE Deficits / Details: MMT ank DF/PF 5/5 RLE Sensation: WNL LLE Deficits / Details: MMT ank DF/PF 5/5, no extensor lag noted LLE Sensation: WNL    Cervical / Trunk Assessment Cervical / Trunk Assessment: Normal  Communication   Communication: No difficulties  Cognition Arousal/Alertness: Awake/alert Behavior During Therapy: WFL for tasks assessed/performed Overall Cognitive Status: Within Functional Limits for tasks assessed                                          General Comments      Exercises Total Joint Exercises Ankle Circles/Pumps: AROM, Both, 10 reps   Assessment/Plan    PT Assessment Patient needs continued PT services  PT Problem List Decreased strength;Decreased range of motion;Decreased activity tolerance;Decreased balance;Decreased mobility;Decreased coordination;Pain       PT Treatment Interventions DME instruction;Gait training;Stair training;Functional mobility training;Therapeutic activities;Therapeutic exercise;Balance training;Neuromuscular re-education;Patient/family education    PT Goals (Current goals can be found in the Care Plan section)  Acute Rehab PT Goals Patient Stated Goal: To go home PT Goal Formulation: With patient Time For Goal Achievement: 10/20/21 Potential to Achieve Goals: Good    Frequency 7X/week     Co-evaluation               AM-PAC  PT "6 Clicks" Mobility  Outcome Measure Help needed turning from your back to your side while in a flat bed without using bedrails?: None Help needed moving from lying on your back to sitting on the side of a flat bed without using bedrails?: A Little Help needed moving to and from a bed to a chair (including a wheelchair)?: A Little Help needed standing up from a chair using your arms (e.g., wheelchair or bedside chair)?: A Little Help needed to walk in hospital room?: A Little Help needed climbing 3-5 steps with a railing? : A Little 6 Click Score: 19    End of Session Equipment Utilized During Treatment: Gait belt Activity Tolerance: Patient tolerated treatment well;No increased pain Patient left: in chair;with call bell/phone within reach;with chair alarm set;with SCD's reapplied Nurse Communication: Mobility status PT Visit Diagnosis: Pain;Difficulty in walking, not elsewhere classified (R26.2) Pain - Right/Left: Left Pain - part of body: Knee    Time: 4193-7902 PT Time Calculation (min) (ACUTE ONLY): 15 min   Charges:   PT Evaluation $PT Eval Low Complexity: [place], PT, DPT [facility] Office: [phone] Pager: [phone]  Coolidge Breeze 10/13/2021, 6:44 PM

## 2021-10-13 NOTE — Discharge Instructions (Signed)
 Dr. Brian Swinteck Total Joint Specialist [facility] [address] [place], [place] [address] [phone]  TOTAL KNEE REPLACEMENT POSTOPERATIVE DIRECTIONS    Knee Rehabilitation, Guidelines Following Surgery  Results after knee surgery are often greatly improved when you follow the exercise, range of motion and muscle strengthening exercises prescribed by your doctor. Safety measures are also important to protect the knee from further injury. Any time any of these exercises cause you to have increased pain or swelling in your knee joint, decrease the amount until you are comfortable again and slowly increase them. If you have problems or questions, call your caregiver or physical therapist for advice.   WEIGHT BEARING Weight bearing as tolerated with assist device (walker, cane, etc) as directed, use it as long as suggested by your surgeon or therapist, typically at least 4-6 weeks.  HOME CARE INSTRUCTIONS  Remove items at home which could result in a fall. This includes throw rugs or furniture in walking pathways.  Continue medications as instructed at time of discharge. You may have some home medications which will be placed on hold until you complete the course of blood thinner medication.  You may start showering once you are discharged home but do not submerge the incision under water. Just pat the incision dry and apply a dry gauze dressing on daily. Walk with walker as instructed.  You may resume a sexual relationship in one month or when given the OK by your doctor.  Use walker as long as suggested by your caregivers. Avoid periods of inactivity such as sitting longer than an hour when not asleep. This helps prevent blood clots.  You may put full weight on your legs and walk as much as is comfortable.  You may return to work once you are cleared by your doctor.  Do not drive a car for 6 weeks or until released by you surgeon.  Do not drive while  taking narcotics.  Wear the elastic stockings for three weeks following surgery during the day but you may remove then at night. Make sure you keep all of your appointments after your operation with all of your doctors and caregivers. You should call the office at the above phone number and make an appointment for approximately two weeks after the date of your surgery. Do not remove your surgical dressing. The dressing is waterproof; you may take showers in 3 days, but do not take tub baths or submerge the dressing. Please pick up a stool softener and laxative for home use as long as you are requiring pain medications. ICE to the affected knee every three hours for 30 minutes at a time and then as needed for pain and swelling.  Continue to use ice on the knee for pain and swelling from surgery. You may notice swelling that will progress down to the foot and ankle.  This is normal after surgery.  Elevate the leg when you are not up walking on it.   It is important for you to complete the blood thinner medication as prescribed by your doctor. Continue to use the breathing machine which will help keep your temperature down.  It is common for your temperature to cycle up and down following surgery, especially at night when you are not up moving around and exerting yourself.  The breathing machine keeps your lungs expanded and your temperature down.  RANGE OF MOTION AND STRENGTHENING EXERCISES  Rehabilitation of the knee is important following a knee injury or an   operation. After just a few days of immobilization, the muscles of the thigh which control the knee become weakened and shrink (atrophy). Knee exercises are designed to build up the tone and strength of the thigh muscles and to improve knee motion. Often times heat used for twenty to thirty minutes before working out will loosen up your tissues and help with improving the range of motion but do not use heat for the first two weeks following surgery.  These exercises can be done on a training (exercise) mat, on the floor, on a table or on a bed. Use what ever works the best and is most comfortable for you Knee exercises include:  Leg Lifts - While your knee is still immobilized in a splint or cast, you can do straight leg raises. Lift the leg to 60 degrees, hold for 3 sec, and slowly lower the leg. Repeat 10-20 times 2-3 times daily. Perform this exercise against resistance later as your knee gets better.  Quad and Hamstring Sets - Tighten up the muscle on the front of the thigh (Quad) and hold for 5-10 sec. Repeat this 10-20 times hourly. Hamstring sets are done by pushing the foot backward against an object and holding for 5-10 sec. Repeat as with quad sets.  A rehabilitation program following serious knee injuries can speed recovery and prevent re-injury in the future due to weakened muscles. Contact your doctor or a physical therapist for more information on knee rehabilitation.   POST-OPERATIVE OPIOID TAPER INSTRUCTIONS: It is important to wean off of your opioid medication as soon as possible. If you do not need pain medication after your surgery it is ok to stop day one. Opioids include: Codeine, Hydrocodone(Norco, Vicodin), Oxycodone(Percocet, oxycontin) and hydromorphone amongst others.  Long term and even short term use of opiods can cause: Increased pain response Dependence Constipation Depression Respiratory depression And more.  Withdrawal symptoms can include Flu like symptoms Nausea, vomiting And more Techniques to manage these symptoms Hydrate well Eat regular healthy meals Stay active Use relaxation techniques(deep breathing, meditating, yoga) Do Not substitute Alcohol to help with tapering If you have been on opioids for less than two weeks and do not have pain than it is ok to stop all together.  Plan to wean off of opioids This plan should start within one week post op of your joint replacement. Maintain the same  interval or time between taking each dose and first decrease the dose.  Cut the total daily intake of opioids by one tablet each day Next start to increase the time between doses. The last dose that should be eliminated is the evening dose.    SKILLED REHAB INSTRUCTIONS: If the patient is transferred to a skilled rehab facility following release from the hospital, a list of the current medications will be sent to the facility for the patient to continue.  When discharged from the skilled rehab facility, please have the facility set up the patient's Home Health Physical Therapy prior to being released. Also, the skilled facility will be responsible for providing the patient with their medications at time of release from the facility to include their pain medication, the muscle relaxants, and their blood thinner medication. If the patient is still at the rehab facility at time of the two week follow up appointment, the skilled rehab facility will also need to assist the patient in arranging follow up appointment in our office and any transportation needs.  MAKE SURE YOU:  Understand these instructions.  Will watch   your condition.  Will get help right away if you are not doing well or get worse.    Pick up stool softner and laxative for home use following surgery while on pain medications. Do NOT remove your dressing. You may shower.  Do not take tub baths or submerge incision under water. May shower starting three days after surgery. Please use a clean towel to pat the incision dry following showers. Continue to use ice for pain and swelling after surgery. Do not use any lotions or creams on the incision until instructed by your surgeon.  

## 2021-10-13 NOTE — Interval H&P Note (Signed)
History and Physical Interval Note:  10/13/2021 9:37 AM  Eric Hammed Sr.  has presented today for surgery, with the diagnosis of Left knee osteoarthritis.  The various methods of treatment have been discussed with the patient and family. After consideration of risks, benefits and other options for treatment, the patient has consented to  Procedure(s) with comments: [place] (Left) - 150 as a surgical intervention.  The patient's history has been reviewed, patient examined, no change in status, stable for surgery.  I have reviewed the patient's chart and labs.  Questions were answered to the patient's satisfaction.    The risks, benefits, and alternatives were discussed with the patient. There are risks associated with the surgery including, but not limited to, problems with anesthesia (death), infection, instability (giving out of the joint), dislocation, differences in leg length/angulation/rotation, fracture of bones, loosening or failure of implants, hematoma (blood accumulation) which may require surgical drainage, blood clots, pulmonary embolism, nerve injury (foot drop and lateral thigh numbness), and blood vessel injury. The patient understands these risks and elects to proceed.   Hilton Cork Britney Newstrom

## 2021-10-13 NOTE — Anesthesia Procedure Notes (Signed)
Anesthesia Regional Block: Adductor canal block   Pre-Anesthetic Checklist: , timeout performed,  Correct Patient, Correct Site, Correct Laterality,  Correct Procedure, Correct Position, site marked,  Risks and benefits discussed,  Surgical consent,  Pre-op evaluation,  At surgeon's request and post-op pain management  Laterality: Lower and Right  Prep: chloraprep       Needles:  Injection technique: Single-shot  Needle Type: Echogenic Needle     Needle Length: 9cm  Needle Gauge: 22     Additional Needles:   Procedures:,,,, ultrasound used (permanent image in chart),,    Narrative:  Start time: 10/13/2021 9:21 AM End time: 10/13/2021 9:27 AM Injection made incrementally with aspirations every 5 mL.  Performed by: Personally  Anesthesiologist: Barnet Glasgow, MD  Additional Notes: Block assessed prior to surgery. Pt tolerated procedure well.

## 2021-10-13 NOTE — Anesthesia Postprocedure Evaluation (Signed)
Anesthesia Post Note  Patient: Eric Hammed Sr.  Procedure(s) Performed: COMPUTER ASSISTED TOTAL KNEE ARTHROPLASTY (Left: Knee)     Patient location during evaluation: Nursing Unit Anesthesia Type: Regional and Spinal Level of consciousness: oriented and awake and alert Pain management: pain level controlled Vital Signs Assessment: post-procedure vital signs reviewed and stable Respiratory status: spontaneous breathing and respiratory function stable Cardiovascular status: blood pressure returned to baseline and stable Postop Assessment: no headache, no backache, no apparent nausea or vomiting and patient able to bend at knees Anesthetic complications: no   No notable events documented.  Last Vitals:  Vitals:   10/13/21 1445 10/13/21 1459  BP: 119/74 123/62  Pulse: (!) 59 (!) 53  Resp: 15 13  Temp: (!) 36.4 C (!) 36.3 C  SpO2: 96% 95%    Last Pain:  Vitals:   10/13/21 1459  TempSrc: Oral  PainSc:                  Barnet Glasgow

## 2021-10-14 DIAGNOSIS — K429 Umbilical hernia without obstruction or gangrene: Secondary | ICD-10-CM | POA: Insufficient documentation

## 2021-10-14 DIAGNOSIS — M1990 Unspecified osteoarthritis, unspecified site: Secondary | ICD-10-CM | POA: Insufficient documentation

## 2021-10-14 DIAGNOSIS — N183 Chronic kidney disease, stage 3 unspecified: Secondary | ICD-10-CM | POA: Diagnosis not present

## 2021-10-14 DIAGNOSIS — G4733 Obstructive sleep apnea (adult) (pediatric): Secondary | ICD-10-CM | POA: Diagnosis not present

## 2021-10-14 DIAGNOSIS — L720 Epidermal cyst: Secondary | ICD-10-CM

## 2021-10-14 DIAGNOSIS — K449 Diaphragmatic hernia without obstruction or gangrene: Secondary | ICD-10-CM | POA: Insufficient documentation

## 2021-10-14 DIAGNOSIS — D170 Benign lipomatous neoplasm of skin and subcutaneous tissue of head, face and neck: Secondary | ICD-10-CM | POA: Insufficient documentation

## 2021-10-14 DIAGNOSIS — I129 Hypertensive chronic kidney disease with stage 1 through stage 4 chronic kidney disease, or unspecified chronic kidney disease: Secondary | ICD-10-CM

## 2021-10-14 DIAGNOSIS — Z4802 Encounter for removal of sutures: Secondary | ICD-10-CM

## 2021-10-14 DIAGNOSIS — H00014 Hordeolum externum left upper eyelid: Secondary | ICD-10-CM | POA: Insufficient documentation

## 2021-10-14 DIAGNOSIS — N1831 Chronic kidney disease, stage 3a: Secondary | ICD-10-CM

## 2021-10-14 DIAGNOSIS — E1122 Type 2 diabetes mellitus with diabetic chronic kidney disease: Secondary | ICD-10-CM

## 2021-10-14 DIAGNOSIS — T1490XS Injury, unspecified, sequela: Secondary | ICD-10-CM | POA: Diagnosis not present

## 2021-10-14 DIAGNOSIS — M25511 Pain in right shoulder: Secondary | ICD-10-CM | POA: Insufficient documentation

## 2021-10-14 DIAGNOSIS — M1732 Unilateral post-traumatic osteoarthritis, left knee: Secondary | ICD-10-CM | POA: Diagnosis not present

## 2021-10-14 DIAGNOSIS — G250 Essential tremor: Secondary | ICD-10-CM | POA: Insufficient documentation

## 2021-10-14 DIAGNOSIS — E559 Vitamin D deficiency, unspecified: Secondary | ICD-10-CM | POA: Insufficient documentation

## 2021-10-14 DIAGNOSIS — G5622 Lesion of ulnar nerve, left upper limb: Secondary | ICD-10-CM | POA: Insufficient documentation

## 2021-10-14 DIAGNOSIS — N390 Urinary tract infection, site not specified: Secondary | ICD-10-CM | POA: Insufficient documentation

## 2021-10-14 DIAGNOSIS — M25561 Pain in right knee: Secondary | ICD-10-CM | POA: Insufficient documentation

## 2021-10-14 DIAGNOSIS — I4891 Unspecified atrial fibrillation: Secondary | ICD-10-CM | POA: Insufficient documentation

## 2021-10-14 DIAGNOSIS — N189 Chronic kidney disease, unspecified: Secondary | ICD-10-CM | POA: Insufficient documentation

## 2021-10-14 DIAGNOSIS — D509 Iron deficiency anemia, unspecified: Secondary | ICD-10-CM | POA: Insufficient documentation

## 2021-10-14 DIAGNOSIS — E119 Type 2 diabetes mellitus without complications: Secondary | ICD-10-CM | POA: Insufficient documentation

## 2021-10-14 HISTORY — DX: Encounter for removal of sutures: Z48.02

## 2021-10-14 HISTORY — DX: Benign lipomatous neoplasm of skin and subcutaneous tissue of head, face and neck: D17.0

## 2021-10-14 HISTORY — DX: Urinary tract infection, site not specified: N39.0

## 2021-10-14 HISTORY — DX: Type 2 diabetes mellitus with diabetic chronic kidney disease: E11.22

## 2021-10-14 HISTORY — DX: Essential tremor: G25.0

## 2021-10-14 HISTORY — DX: Hypertensive chronic kidney disease with stage 1 through stage 4 chronic kidney disease, or unspecified chronic kidney disease: I12.9

## 2021-10-14 HISTORY — DX: Hordeolum externum left upper eyelid: H00.014

## 2021-10-14 HISTORY — DX: Chronic kidney disease, stage 3a: N18.31

## 2021-10-14 HISTORY — DX: Lesion of ulnar nerve, left upper limb: G56.22

## 2021-10-14 HISTORY — DX: Epidermal cyst: L72.0

## 2021-10-14 LAB — BASIC METABOLIC PANEL
Anion gap: 4 — ABNORMAL LOW (ref 5–15)
BUN: 27 mg/dL — ABNORMAL HIGH (ref 8–23)
CO2: 26 mmol/L (ref 22–32)
Calcium: 8.7 mg/dL — ABNORMAL LOW (ref 8.9–10.3)
Chloride: 107 mmol/L (ref 98–111)
Creatinine, Ser: 1.54 mg/dL — ABNORMAL HIGH (ref 0.61–1.24)
GFR, Estimated: 45 mL/min — ABNORMAL LOW (ref >60)
Glucose, Bld: 188 mg/dL — ABNORMAL HIGH (ref 70–99)
Potassium: 4.8 mmol/L (ref 3.5–5.1)
Sodium: 137 mmol/L (ref 135–145)

## 2021-10-14 LAB — GLUCOSE, CAPILLARY
Glucose-Capillary: 172 mg/dL — ABNORMAL HIGH (ref 70–99)
Glucose-Capillary: 144 mg/dL — ABNORMAL HIGH (ref 70–99)

## 2021-10-14 LAB — CBC
HCT: 42.1 % (ref 39.0–52.0)
Hemoglobin: 13.3 g/dL (ref 13.0–17.0)
MCH: 29.4 pg (ref 26.0–34.0)
MCHC: 31.6 g/dL (ref 30.0–36.0)
MCV: 92.9 fL (ref 80.0–100.0)
Platelets: 122 10*3/uL — ABNORMAL LOW (ref 150–400)
RBC: 4.53 MIL/uL (ref 4.22–5.81)
RDW: 13.9 % (ref 11.5–15.5)
WBC: 6.7 10*3/uL (ref 4.0–10.5)
nRBC: 0 % (ref 0.0–0.2)

## 2021-10-14 MED ORDER — SENNA 8.6 MG PO TABS
2.0000 | ORAL_TABLET | Freq: Every day | ORAL | 0 refills | Status: AC
Start: 2021-10-14 — End: 2021-10-29

## 2021-10-14 MED ORDER — POLYETHYLENE GLYCOL 3350 17 G PO PACK: 17.0000 g | PACK | Freq: Every day | ORAL | 0 refills | Status: AC | PRN

## 2021-10-14 MED ORDER — ONDANSETRON HCL 4 MG PO TABS: 4.0000 mg | ORAL_TABLET | Freq: Three times a day (TID) | ORAL | 0 refills | Status: AC | PRN

## 2021-10-14 MED ORDER — OXYCODONE HCL 5 MG PO TABS: 5.0000 mg | ORAL_TABLET | ORAL | 0 refills | Status: AC | PRN

## 2021-10-14 MED ORDER — DOCUSATE SODIUM 100 MG PO CAPS: 100.0000 mg | ORAL_CAPSULE | Freq: Two times a day (BID) | ORAL | 0 refills | Status: AC

## 2021-10-14 NOTE — Inpatient Diabetes Management (Signed)
Inpatient Diabetes Program Recommendations  AACE/ADA: New Consensus Statement on Inpatient Glycemic Control (2015)  Target Ranges:  Prepandial:   less than 140 mg/dL      Peak postprandial:   less than 180 mg/dL (1-2 hours)      Critically ill patients:  140 - 180 mg/dL   Lab Results  Component Value Date   GLUCAP 172 (H) 10/14/2021   HGBA1C 6.7 (H) 10/01/2021    Review of Glycemic Control  Diabetes history: DM2 Outpatient Diabetes medications: Jardiance 12.5 QD, metformin 500 mg BID, Lantus 20 QHS, Ozempic 0.5 mg weekly Current orders for Inpatient glycemic control: Novolog 0-9 TID  HgbA1C - 6.7% Did not receive any basal insulin last night CBG this am - 172 mg/dL  Inpatient Diabetes Program Recommendations:    Resume home meds when discharged.  Thank you. Lorenda Peck, RD, LDN, [facility] Inpatient Diabetes Coordinator [phone]

## 2021-10-14 NOTE — TOC Transition Note (Signed)
Transition of Care [facility]) - CM/SW Discharge Note   Patient Details  Name: Eric Shelton Sr. MRN: [id number] Date of Birth: 01-28-1941  [facility] CM/SW Contact:  Lennart Pall, LCSW Phone Number: 10/14/2021, 9:37 AM   Clinical Narrative:    Met with pt and confirming he has received RW via [place].  OPPT already arranged with [facility].  No further TOC needs.   Final next level of care: OP Rehab Barriers to Discharge: No Barriers Identified   Patient Goals and CMS Choice Patient states their goals for this hospitalization and ongoing recovery are:: return home      Discharge Placement                       Discharge Plan and Services                DME Arranged: Walker rolling DME Agency: [employer]       HH Arranged: NA [place] Agency: NA        Social Determinants of Health (SDOH) Interventions     Readmission Risk Interventions     No data to display

## 2021-10-14 NOTE — Plan of Care (Signed)

## 2021-10-14 NOTE — Progress Notes (Signed)
Physical Therapy Treatment Patient Details Name: Eric Bray. 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 PMH: DM with neuropathy, afib, CKD, GERD, gout, HLD, HTN, bilateral carpal tunnel release 2010. L foot fusion 2018.    PT Comments    Pt dressed and eager to mobilize.  Pt ambulated again and practiced safe stair technique.  Pt provided with gait belt to use at home and states his daughter will be picking him up.  Pt feels ready for d/c home today and had no further questions.   Recommendations for follow up therapy are one component of a multi-disciplinary discharge planning process, led by the attending physician.  Recommendations may be updated based on patient status, additional functional criteria and insurance authorization.  Follow Up Recommendations  Follow physician's recommendations for discharge plan and follow up therapies     Assistance Recommended at Discharge Set up Supervision/Assistance  Patient can return home with the following A little help with walking and/or transfers;A little help with bathing/dressing/bathroom;Assistance with cooking/housework;Assist for transportation;Help with stairs or ramp for entrance   Equipment Recommendations  Rolling walker (2 wheels)    Recommendations for Other Services       Precautions / Restrictions Precautions Precautions: Knee;Fall Precaution Comments: no pillow under knee Restrictions Weight Bearing Restrictions: No Other Position/Activity Restrictions: WBAT     Mobility  Bed Mobility               General bed mobility comments: pt in recliner    Transfers Overall transfer level: Needs assistance Equipment used: Rolling walker (2 wheels) Transfers: Sit to/from Stand Sit to Stand: Min guard           General transfer comment: verbal cues for UE assist    Ambulation/Gait Ambulation/Gait assistance:  Min guard Gait Distance (Feet): 300 Feet Assistive device: Rolling walker (2 wheels) Gait Pattern/deviations: Step-to pattern, Decreased weight shift to left, Antalgic, Knee flexed in stance - left Gait velocity: decreased     General Gait Details: verbal cues for step length, RW positioning, posture   Stairs Stairs: Yes Stairs assistance: Min guard Stair Management: Forwards, One rail Left, Step to pattern Number of Stairs: 3 General stair comments: verbal cues for sequence and safety; pt declined use of SPC; performed twice and pt reports understanding   Wheelchair Mobility    Modified Rankin (Stroke Patients Only)       Balance                                            Cognition Arousal/Alertness: Awake/alert Behavior During Therapy: WFL for tasks assessed/performed Overall Cognitive Status: Within Functional Limits for tasks assessed                                          Exercises     General Comments        Pertinent Vitals/Pain Pain Assessment Pain Assessment: 0-10 Pain Score: 5  Pain Location: left knee Pain Descriptors / Indicators: Grimacing, Sore Pain Intervention(s): Repositioned, Monitored during session    Home Living  Prior Function            PT Goals (current goals can now be found in the care plan section) Progress towards PT goals: Progressing toward goals    Frequency    7X/week      PT Plan Current plan remains appropriate    Co-evaluation              AM-PAC PT "6 Clicks" Mobility   Outcome Measure  Help needed turning from your back to your side while in a flat bed without using bedrails?: None Help needed moving from lying on your back to sitting on the side of a flat bed without using bedrails?: A Little Help needed moving to and from a bed to a chair (including a wheelchair)?: A Little Help needed standing up from a chair using your  arms (e.g., wheelchair or bedside chair)?: A Little Help needed to walk in hospital room?: A Little Help needed climbing 3-5 steps with a railing? : A Little 6 Click Score: 19    End of Session Equipment Utilized During Treatment: Gait belt Activity Tolerance: Patient tolerated treatment well Patient left: in chair;with call bell/phone within reach;with chair alarm set Nurse Communication: Mobility status PT Visit Diagnosis: Difficulty in walking, not elsewhere classified (R26.2) Pain - Right/Left: Left Pain - part of body: Knee     Time: 1147-1205 PT Time Calculation (min) (ACUTE ONLY): 18 min  Charges:  $Gait Training: 8-22 mins                     Arlyce Dice, DPT Physical Therapist Acute Rehabilitation Services Preferred contact method: Secure Chat Weekend Pager Only: [phone] Office: [place] 10/14/2021, 2:38 PM

## 2021-10-14 NOTE — Progress Notes (Signed)
Physical Therapy Treatment Patient Details Name: Eric Mellinger Sr. MRN: [id number] DOB: 1940/11/17 Today's Date: 10/14/2021   History of Present Illness Pt is an 81 yo male presenting s/p L-TKA on 10/13/21. PMH: DM with neuropathy, afib, CKD, GERD, gout, HLD, HTN, bilateral carpal tunnel release 2010. L foot fusion 2018.    PT Comments    Pt ambulated in hallway and practiced stairs.  Pt also performed LE exercises and provided with HEP handout.  Pt would benefit from practicing stairs once more prior to d/c home today.   Recommendations for follow up therapy are one component of a multi-disciplinary discharge planning process, led by the attending physician.  Recommendations may be updated based on patient status, additional functional criteria and insurance authorization.  Follow Up Recommendations  Follow physician's recommendations for discharge plan and follow up therapies     Assistance Recommended at Discharge Set up Supervision/Assistance  Patient can return home with the following A little help with walking and/or transfers;A little help with bathing/dressing/bathroom;Assistance with cooking/housework;Assist for transportation;Help with stairs or ramp for entrance   Equipment Recommendations  Rolling walker (2 wheels)    Recommendations for Other Services       Precautions / Restrictions Precautions Precautions: Knee;Fall Precaution Comments: no pillow under knee Restrictions Weight Bearing Restrictions: No Other Position/Activity Restrictions: WBAT     Mobility  Bed Mobility               General bed mobility comments: pt in recliner    Transfers Overall transfer level: Needs assistance Equipment used: Rolling walker (2 wheels) Transfers: Sit to/from Stand Sit to Stand: Min guard           General transfer comment: verbal cues for UE assist    Ambulation/Gait Ambulation/Gait assistance: Min guard Gait Distance (Feet): 240 Feet Assistive device:  Rolling walker (2 wheels) Gait Pattern/deviations: Step-to pattern, Decreased weight shift to left, Antalgic, Knee flexed in stance - left Gait velocity: decreased     General Gait Details: verbal cues for step length, RW positioning, posture   Stairs Stairs: Yes Stairs assistance: Min guard, Min assist Stair Management: Forwards, One rail Left, Step to pattern Number of Stairs: 3 General stair comments: verbal cues for sequence and safety; assist required with wrong sequence to steady; performed twice   Wheelchair Mobility    Modified Rankin (Stroke Patients Only)       Balance                                            Cognition Arousal/Alertness: Awake/alert Behavior During Therapy: WFL for tasks assessed/performed Overall Cognitive Status: Within Functional Limits for tasks assessed                                          Exercises Total Joint Exercises Ankle Circles/Pumps: AROM, Both, 10 reps Quad Sets: AROM, Both, 10 reps Heel Slides: Left, 10 reps, AAROM Hip ABduction/ADduction: AROM, Left, 10 reps Straight Leg Raises: AROM, Left, 10 reps Knee Flexion: Left, 10 reps, AROM, Seated    General Comments        Pertinent Vitals/Pain Pain Assessment Pain Assessment: 0-10 Pain Score: 6  Pain Location: left knee Pain Descriptors / Indicators: Grimacing, Sore Pain Intervention(s): Repositioned, Monitored during session    Home  Living                          Prior Function            PT Goals (current goals can now be found in the care plan section) Progress towards PT goals: Progressing toward goals    Frequency    7X/week      PT Plan Current plan remains appropriate    Co-evaluation              AM-PAC PT "6 Clicks" Mobility   Outcome Measure  Help needed turning from your back to your side while in a flat bed without using bedrails?: None Help needed moving from lying on your back to  sitting on the side of a flat bed without using bedrails?: A Little Help needed moving to and from a bed to a chair (including a wheelchair)?: A Little Help needed standing up from a chair using your arms (e.g., wheelchair or bedside chair)?: A Little Help needed to walk in hospital room?: A Little Help needed climbing 3-5 steps with a railing? : A Little 6 Click Score: 19    End of Session Equipment Utilized During Treatment: Gait belt Activity Tolerance: Patient tolerated treatment well Patient left: in chair;with call bell/phone within reach;with chair alarm set   PT Visit Diagnosis: Pain;Difficulty in walking, not elsewhere classified (R26.2) Pain - Right/Left: Left Pain - part of body: Knee     Time: 5852-7782 PT Time Calculation (min) (ACUTE ONLY): 22 min  Charges:  $Therapeutic Exercise: 8-22 mins                    Jannette Spanner PT, DPT Physical Therapist Acute Rehabilitation Services Preferred contact method: Secure Chat Weekend Pager Only: [phone] Office: [place] 10/14/2021, 2:35 PM

## 2021-10-14 NOTE — Progress Notes (Signed)
    Subjective:  Patient reports pain as mild.  Denies N/V/CP/SOB/Abd pain. He has not required any pain medication. He states his knee feels good. Denies tingling and numbness in LE bilaterally. He is ready to go home.   Objective:   VITALS:   Vitals:   10/13/21 2122 10/13/21 2131 10/14/21 0132 10/14/21 0629  BP: (!) 155/62 132/75 (!) 143/91 (!) 164/88  Pulse: 85 83 70 69  Resp:  '18 18 18  '$ Temp:  97.9 F (36.6 C) 97.9 F (36.6 C) 97.9 F (36.6 C)  TempSrc:  Oral Oral Oral  SpO2:  95% 98% 96%  Weight:      Height:        Patient sitting in recliner this morning. NAD Neurologically intact ABD soft Neurovascular intact Sensation intact distally Intact pulses distally Dorsiflexion/Plantar flexion intact Incision: dressing C/D/I No cellulitis present Compartment soft   Lab Results  Component Value Date   WBC 6.7 10/14/2021   HGB 13.3 10/14/2021   HCT 42.1 10/14/2021   MCV 92.9 10/14/2021   PLT 122 (L) 10/14/2021   BMET    Component Value Date/Time   NA 137 10/14/2021 0348   K 4.8 10/14/2021 0348   CL 107 10/14/2021 0348   CO2 26 10/14/2021 0348   GLUCOSE 188 (H) 10/14/2021 0348   BUN 27 (H) 10/14/2021 0348   CREATININE 1.54 (H) 10/14/2021 0348   CALCIUM 8.7 (L) 10/14/2021 0348   GFRNONAA 45 (L) 10/14/2021 0348     Assessment/Plan: 1 Day Post-Op   Principal Problem:   Post-traumatic osteoarthritis of left knee  Patient has CKD,  Cr 1.54 this morning. 1.52 prior to hospital admission.   WBAT with walker DVT ppx:  Eliquis 2.'5mg'$  BID , SCDs, TEDS PO pain control PT/OT: Ambulated 20 feet with PT yesterday. Continue PT today.  Dispo: D/c home with OPPT once cleared with PT.     Charlott Rakes, PA-C 10/14/2021, 7:17 AM   [facility]  [place] [address], [place], [place] [address]

## 2021-10-14 NOTE — Plan of Care (Signed)
Problem: Education: Goal: Ability to describe self-care measures that may prevent or decrease complications (Diabetes Survival Skills Education) will improve 10/14/2021 0857 by Drake Wuertz L, RN Outcome: Adequate for Discharge 10/14/2021 8299 by Harsha Yusko L, RN Outcome: Progressing   Problem: Coping: Goal: Ability to adjust to condition or change in health will improve 10/14/2021 0857 by Liany Mumpower L, RN Outcome: Adequate for Discharge 10/14/2021 3716 by Tamlyn Sides L, RN Outcome: Progressing   Problem: Fluid Volume: Goal: Ability to maintain a balanced intake and output will improve 10/14/2021 0857 by Amauri Keefe L, RN Outcome: Adequate for Discharge 10/14/2021 9678 by Mikaelah Trostle L, RN Outcome: Progressing   Problem: Health Behavior/Discharge Planning: Goal: Ability to identify and utilize available resources and services will improve 10/14/2021 0857 by Kiara Mcdowell L, RN Outcome: Adequate for Discharge 10/14/2021 9381 by Parthena Fergeson L, RN Outcome: Progressing Goal: Ability to manage health-related needs will improve 10/14/2021 0857 by Adalae Baysinger L, RN Outcome: Adequate for Discharge 10/14/2021 0175 by Camara Renstrom L, RN Outcome: Progressing   Problem: Metabolic: Goal: Ability to maintain appropriate glucose levels will improve 10/14/2021 0857 by Kierstyn Baranowski L, RN Outcome: Adequate for Discharge 10/14/2021 1025 by Maguadalupe Lata L, RN Outcome: Progressing   Problem: Nutritional: Goal: Maintenance of adequate nutrition will improve 10/14/2021 0857 by Nyasia Baxley L, RN Outcome: Adequate for Discharge 10/14/2021 8527 by Jihad Brownlow L, RN Outcome: Progressing Goal: Progress toward achieving an optimal weight will improve 10/14/2021 0857 by Medrith Veillon L, RN Outcome: Adequate for Discharge 10/14/2021 7824 by Tyarra Nolton L, RN Outcome: Progressing   Problem: Skin Integrity: Goal: Risk for impaired skin integrity will decrease 10/14/2021 0857 by  Aj Crunkleton L, RN Outcome: Adequate for Discharge 10/14/2021 2353 by Lakeem Rozo L, RN Outcome: Progressing   Problem: Education: Goal: Knowledge of the prescribed therapeutic regimen will improve 10/14/2021 0857 by Kavaughn Faucett L, RN Outcome: Adequate for Discharge 10/14/2021 6144 by Ashur Glatfelter L, RN Outcome: Progressing   Problem: Activity: Goal: Ability to avoid complications of mobility impairment will improve 10/14/2021 0857 by Jezebel Pollet L, RN Outcome: Adequate for Discharge 10/14/2021 3154 by Chosen Geske L, RN Outcome: Progressing Goal: Range of joint motion will improve 10/14/2021 0857 by Antoney Biven L, RN Outcome: Adequate for Discharge 10/14/2021 0086 by Georges Victorio L, RN Outcome: Progressing   Problem: Clinical Measurements: Goal: Postoperative complications will be avoided or minimized 10/14/2021 0857 by Malicia Blasdel L, RN Outcome: Adequate for Discharge 10/14/2021 7619 by Dashae Wilcher L, RN Outcome: Progressing   Problem: Pain Management: Goal: Pain level will decrease with appropriate interventions 10/14/2021 0857 by Ozzie Remmers L, RN Outcome: Adequate for Discharge 10/14/2021 5093 by Merideth Bosque L, RN Outcome: Progressing   Problem: Skin Integrity: Goal: Will show signs of wound healing 10/14/2021 0857 by Adeoluwa Silvers L, RN Outcome: Adequate for Discharge 10/14/2021 2671 by Mckenize Mezera L, RN Outcome: Progressing   Problem: Education: Goal: Knowledge of General Education information will improve Description: Including pain rating scale, medication(s)/side effects and non-pharmacologic comfort measures 10/14/2021 0857 by Aaleah Hirsch L, RN Outcome: Adequate for Discharge 10/14/2021 2458 by Jalan Fariss L, RN Outcome: Progressing   Problem: Health Behavior/Discharge Planning: Goal: Ability to manage health-related needs will improve 10/14/2021 0857 by Zared Knoth L, RN Outcome: Adequate for Discharge 10/14/2021 0998 by Lillyian Heidt L,  RN Outcome: Progressing   Problem: Clinical Measurements: Goal: Ability to maintain clinical measurements within normal limits will improve 10/14/2021 0857 by Abeera Flannery L, RN Outcome: Adequate  for Discharge 10/14/2021 3094 by Sheralyn Pinegar L, RN Outcome: Progressing Goal: Will remain free from infection 10/14/2021 0857 by Breena Bevacqua L, RN Outcome: Adequate for Discharge 10/14/2021 0768 by Less Woolsey L, RN Outcome: Progressing Goal: Diagnostic test results will improve 10/14/2021 0857 by Crucita Lacorte L, RN Outcome: Adequate for Discharge 10/14/2021 0881 by Annasofia Pohl L, RN Outcome: Progressing Goal: Respiratory complications will improve 10/14/2021 0857 by Amori Cooperman L, RN Outcome: Adequate for Discharge 10/14/2021 1031 by Meria Crilly L, RN Outcome: Progressing Goal: Cardiovascular complication will be avoided 10/14/2021 0857 by Nelta Caudill L, RN Outcome: Adequate for Discharge 10/14/2021 5945 by Abigale Dorow L, RN Outcome: Progressing   Problem: Activity: Goal: Risk for activity intolerance will decrease 10/14/2021 0857 by Maygen Sirico L, RN Outcome: Adequate for Discharge 10/14/2021 8592 by Jakarri Lesko L, RN Outcome: Progressing   Problem: Nutrition: Goal: Adequate nutrition will be maintained 10/14/2021 0857 by Janei Scheff L, RN Outcome: Adequate for Discharge 10/14/2021 0728 by Cathlene Gardella L, RN Outcome: Progressing   Problem: Coping: Goal: Level of anxiety will decrease 10/14/2021 0857 by Viveca Beckstrom L, RN Outcome: Adequate for Discharge 10/14/2021 9244 by Treacy Holcomb L, RN Outcome: Progressing   Problem: Elimination: Goal: Will not experience complications related to bowel motility 10/14/2021 0857 by Ma Munoz L, RN Outcome: Adequate for Discharge 10/14/2021 6286 by Kirsti Mcalpine L, RN Outcome: Progressing Goal: Will not experience complications related to urinary retention 10/14/2021 0857 by Darcy Cordner L, RN Outcome: Adequate for  Discharge 10/14/2021 3817 by Randon Somera L, RN Outcome: Progressing   Problem: Pain Managment: Goal: General experience of comfort will improve 10/14/2021 0857 by Mansi Tokar L, RN Outcome: Adequate for Discharge 10/14/2021 7116 by Parker Sawatzky L, RN Outcome: Progressing   Problem: Safety: Goal: Ability to remain free from injury will improve 10/14/2021 0857 by Yuna Pizzolato L, RN Outcome: Adequate for Discharge 10/14/2021 5790 by Nijae Doyel L, RN Outcome: Progressing   Problem: Skin Integrity: Goal: Risk for impaired skin integrity will decrease 10/14/2021 0857 by Anapaula Severt L, RN Outcome: Adequate for Discharge 10/14/2021 3833 by Kammi Hechler L, RN Outcome: Progressing

## 2021-10-15 DIAGNOSIS — M25562 Pain in left knee: Secondary | ICD-10-CM | POA: Diagnosis not present

## 2021-10-15 DIAGNOSIS — R2689 Other abnormalities of gait and mobility: Secondary | ICD-10-CM | POA: Diagnosis not present

## 2021-10-15 DIAGNOSIS — Z96652 Presence of left artificial knee joint: Secondary | ICD-10-CM | POA: Diagnosis not present

## 2021-10-15 DIAGNOSIS — M25462 Effusion, left knee: Secondary | ICD-10-CM | POA: Diagnosis not present

## 2021-10-19 DIAGNOSIS — M25462 Effusion, left knee: Secondary | ICD-10-CM | POA: Diagnosis not present

## 2021-10-19 DIAGNOSIS — Z96652 Presence of left artificial knee joint: Secondary | ICD-10-CM | POA: Diagnosis not present

## 2021-10-19 DIAGNOSIS — M25562 Pain in left knee: Secondary | ICD-10-CM | POA: Diagnosis not present

## 2021-10-19 DIAGNOSIS — R2689 Other abnormalities of gait and mobility: Secondary | ICD-10-CM | POA: Diagnosis not present

## 2021-10-20 DIAGNOSIS — Z96652 Presence of left artificial knee joint: Secondary | ICD-10-CM | POA: Diagnosis not present

## 2021-10-20 DIAGNOSIS — M25462 Effusion, left knee: Secondary | ICD-10-CM | POA: Diagnosis not present

## 2021-10-20 DIAGNOSIS — R2689 Other abnormalities of gait and mobility: Secondary | ICD-10-CM | POA: Diagnosis not present

## 2021-10-20 DIAGNOSIS — M25562 Pain in left knee: Secondary | ICD-10-CM | POA: Diagnosis not present

## 2021-10-22 ENCOUNTER — Ambulatory Visit: Payer: Medicare Other | Admitting: Cardiology

## 2021-10-22 DIAGNOSIS — M25462 Effusion, left knee: Secondary | ICD-10-CM | POA: Diagnosis not present

## 2021-10-22 DIAGNOSIS — M25562 Pain in left knee: Secondary | ICD-10-CM | POA: Diagnosis not present

## 2021-10-22 DIAGNOSIS — R2689 Other abnormalities of gait and mobility: Secondary | ICD-10-CM | POA: Diagnosis not present

## 2021-10-22 DIAGNOSIS — Z96652 Presence of left artificial knee joint: Secondary | ICD-10-CM | POA: Diagnosis not present

## 2021-10-25 DIAGNOSIS — R2689 Other abnormalities of gait and mobility: Secondary | ICD-10-CM | POA: Diagnosis not present

## 2021-10-25 DIAGNOSIS — M25462 Effusion, left knee: Secondary | ICD-10-CM | POA: Diagnosis not present

## 2021-10-25 DIAGNOSIS — M25562 Pain in left knee: Secondary | ICD-10-CM | POA: Diagnosis not present

## 2021-10-25 DIAGNOSIS — Z96652 Presence of left artificial knee joint: Secondary | ICD-10-CM | POA: Diagnosis not present

## 2021-10-27 DIAGNOSIS — M25562 Pain in left knee: Secondary | ICD-10-CM | POA: Diagnosis not present

## 2021-10-27 DIAGNOSIS — M25462 Effusion, left knee: Secondary | ICD-10-CM | POA: Diagnosis not present

## 2021-10-27 DIAGNOSIS — Z96652 Presence of left artificial knee joint: Secondary | ICD-10-CM | POA: Diagnosis not present

## 2021-10-27 DIAGNOSIS — R2689 Other abnormalities of gait and mobility: Secondary | ICD-10-CM | POA: Diagnosis not present

## 2021-10-29 DIAGNOSIS — M25462 Effusion, left knee: Secondary | ICD-10-CM | POA: Diagnosis not present

## 2021-10-29 DIAGNOSIS — M25562 Pain in left knee: Secondary | ICD-10-CM | POA: Diagnosis not present

## 2021-10-29 DIAGNOSIS — Z471 Aftercare following joint replacement surgery: Secondary | ICD-10-CM | POA: Diagnosis not present

## 2021-10-29 DIAGNOSIS — Z96652 Presence of left artificial knee joint: Secondary | ICD-10-CM | POA: Diagnosis not present

## 2021-10-29 DIAGNOSIS — R2689 Other abnormalities of gait and mobility: Secondary | ICD-10-CM | POA: Diagnosis not present

## 2021-11-01 DIAGNOSIS — Z96652 Presence of left artificial knee joint: Secondary | ICD-10-CM | POA: Diagnosis not present

## 2021-11-01 DIAGNOSIS — M25462 Effusion, left knee: Secondary | ICD-10-CM | POA: Diagnosis not present

## 2021-11-01 DIAGNOSIS — M25562 Pain in left knee: Secondary | ICD-10-CM | POA: Diagnosis not present

## 2021-11-01 DIAGNOSIS — R2689 Other abnormalities of gait and mobility: Secondary | ICD-10-CM | POA: Diagnosis not present

## 2021-11-03 DIAGNOSIS — Z96652 Presence of left artificial knee joint: Secondary | ICD-10-CM | POA: Diagnosis not present

## 2021-11-03 DIAGNOSIS — R2689 Other abnormalities of gait and mobility: Secondary | ICD-10-CM | POA: Diagnosis not present

## 2021-11-03 DIAGNOSIS — M25462 Effusion, left knee: Secondary | ICD-10-CM | POA: Diagnosis not present

## 2021-11-03 DIAGNOSIS — M25562 Pain in left knee: Secondary | ICD-10-CM | POA: Diagnosis not present

## 2021-11-05 DIAGNOSIS — Z96652 Presence of left artificial knee joint: Secondary | ICD-10-CM | POA: Diagnosis not present

## 2021-11-05 DIAGNOSIS — M25562 Pain in left knee: Secondary | ICD-10-CM | POA: Diagnosis not present

## 2021-11-05 DIAGNOSIS — M25462 Effusion, left knee: Secondary | ICD-10-CM | POA: Diagnosis not present

## 2021-11-05 DIAGNOSIS — R2689 Other abnormalities of gait and mobility: Secondary | ICD-10-CM | POA: Diagnosis not present

## 2021-11-08 DIAGNOSIS — M25562 Pain in left knee: Secondary | ICD-10-CM | POA: Diagnosis not present

## 2021-11-08 DIAGNOSIS — Z96652 Presence of left artificial knee joint: Secondary | ICD-10-CM | POA: Diagnosis not present

## 2021-11-08 DIAGNOSIS — R2689 Other abnormalities of gait and mobility: Secondary | ICD-10-CM | POA: Diagnosis not present

## 2021-11-08 DIAGNOSIS — M25462 Effusion, left knee: Secondary | ICD-10-CM | POA: Diagnosis not present

## 2021-11-10 DIAGNOSIS — Z96652 Presence of left artificial knee joint: Secondary | ICD-10-CM | POA: Diagnosis not present

## 2021-11-10 DIAGNOSIS — M25562 Pain in left knee: Secondary | ICD-10-CM | POA: Diagnosis not present

## 2021-11-10 DIAGNOSIS — M25462 Effusion, left knee: Secondary | ICD-10-CM | POA: Diagnosis not present

## 2021-11-10 DIAGNOSIS — R2689 Other abnormalities of gait and mobility: Secondary | ICD-10-CM | POA: Diagnosis not present

## 2021-11-12 DIAGNOSIS — M25562 Pain in left knee: Secondary | ICD-10-CM | POA: Diagnosis not present

## 2021-11-12 DIAGNOSIS — Z96652 Presence of left artificial knee joint: Secondary | ICD-10-CM | POA: Diagnosis not present

## 2021-11-12 DIAGNOSIS — R2689 Other abnormalities of gait and mobility: Secondary | ICD-10-CM | POA: Diagnosis not present

## 2021-11-12 DIAGNOSIS — M25462 Effusion, left knee: Secondary | ICD-10-CM | POA: Diagnosis not present

## 2021-11-16 DIAGNOSIS — R2689 Other abnormalities of gait and mobility: Secondary | ICD-10-CM | POA: Diagnosis not present

## 2021-11-16 DIAGNOSIS — Z96652 Presence of left artificial knee joint: Secondary | ICD-10-CM | POA: Diagnosis not present

## 2021-11-16 DIAGNOSIS — M25562 Pain in left knee: Secondary | ICD-10-CM | POA: Diagnosis not present

## 2021-11-16 DIAGNOSIS — M25462 Effusion, left knee: Secondary | ICD-10-CM | POA: Diagnosis not present

## 2021-11-19 DIAGNOSIS — Z96652 Presence of left artificial knee joint: Secondary | ICD-10-CM | POA: Diagnosis not present

## 2021-11-19 DIAGNOSIS — R2689 Other abnormalities of gait and mobility: Secondary | ICD-10-CM | POA: Diagnosis not present

## 2021-11-19 DIAGNOSIS — M25462 Effusion, left knee: Secondary | ICD-10-CM | POA: Diagnosis not present

## 2021-11-19 DIAGNOSIS — M25562 Pain in left knee: Secondary | ICD-10-CM | POA: Diagnosis not present

## 2021-11-22 DIAGNOSIS — M25562 Pain in left knee: Secondary | ICD-10-CM | POA: Diagnosis not present

## 2021-11-22 DIAGNOSIS — R2689 Other abnormalities of gait and mobility: Secondary | ICD-10-CM | POA: Diagnosis not present

## 2021-11-22 DIAGNOSIS — Z96652 Presence of left artificial knee joint: Secondary | ICD-10-CM | POA: Diagnosis not present

## 2021-11-22 DIAGNOSIS — M25462 Effusion, left knee: Secondary | ICD-10-CM | POA: Diagnosis not present

## 2021-11-25 DIAGNOSIS — M25562 Pain in left knee: Secondary | ICD-10-CM | POA: Diagnosis not present

## 2021-11-25 DIAGNOSIS — R2689 Other abnormalities of gait and mobility: Secondary | ICD-10-CM | POA: Diagnosis not present

## 2021-11-25 DIAGNOSIS — Z96652 Presence of left artificial knee joint: Secondary | ICD-10-CM | POA: Diagnosis not present

## 2021-11-25 DIAGNOSIS — M25462 Effusion, left knee: Secondary | ICD-10-CM | POA: Diagnosis not present

## 2021-11-29 DIAGNOSIS — Z471 Aftercare following joint replacement surgery: Secondary | ICD-10-CM | POA: Diagnosis not present

## 2021-11-29 DIAGNOSIS — Z96652 Presence of left artificial knee joint: Secondary | ICD-10-CM | POA: Diagnosis not present

## 2021-12-06 DIAGNOSIS — N183 Chronic kidney disease, stage 3 unspecified: Secondary | ICD-10-CM | POA: Diagnosis not present

## 2021-12-10 DIAGNOSIS — N2581 Secondary hyperparathyroidism of renal origin: Secondary | ICD-10-CM | POA: Diagnosis not present

## 2021-12-10 DIAGNOSIS — D631 Anemia in chronic kidney disease: Secondary | ICD-10-CM | POA: Diagnosis not present

## 2021-12-10 DIAGNOSIS — R809 Proteinuria, unspecified: Secondary | ICD-10-CM | POA: Diagnosis not present

## 2021-12-10 DIAGNOSIS — I129 Hypertensive chronic kidney disease with stage 1 through stage 4 chronic kidney disease, or unspecified chronic kidney disease: Secondary | ICD-10-CM | POA: Diagnosis not present

## 2021-12-10 DIAGNOSIS — N183 Chronic kidney disease, stage 3 unspecified: Secondary | ICD-10-CM | POA: Diagnosis not present

## 2021-12-31 DIAGNOSIS — R3129 Other microscopic hematuria: Secondary | ICD-10-CM | POA: Diagnosis not present

## 2021-12-31 DIAGNOSIS — R1084 Generalized abdominal pain: Secondary | ICD-10-CM | POA: Diagnosis not present

## 2021-12-31 DIAGNOSIS — R81 Glycosuria: Secondary | ICD-10-CM | POA: Diagnosis not present

## 2021-12-31 DIAGNOSIS — R112 Nausea with vomiting, unspecified: Secondary | ICD-10-CM | POA: Diagnosis not present

## 2023-01-06 ENCOUNTER — Encounter: Payer: Self-pay | Admitting: Neurology

## 2023-02-21 ENCOUNTER — Ambulatory Visit (INDEPENDENT_AMBULATORY_CARE_PROVIDER_SITE_OTHER): Payer: No Typology Code available for payment source | Admitting: Neurology

## 2023-02-21 ENCOUNTER — Encounter: Payer: Self-pay | Admitting: Neurology

## 2023-02-21 VITALS — BP 149/70 | HR 74 | Ht 71.0 in | Wt 202.0 lb

## 2023-02-21 DIAGNOSIS — G629 Polyneuropathy, unspecified: Secondary | ICD-10-CM

## 2023-02-21 NOTE — Progress Notes (Signed)
 [facility] Clinic Note - Initial Visit   Date: 02/21/2023   Eric Demma Sr. MRN: [id number] DOB: 07/28/1940   Dear Dr. Micheline:  Thank you for your kind referral of Eric Hinchman Sr. for consultation of neuropathy. Although his history is well known to you, please allow us  to reiterate it for the purpose of our medical record. The patient was accompanied to the clinic by self.    Eric Mangel Sr. is a 83 y.o. 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   IMPRESSION/PLAN: Bilateral feet numbness, most likely neuropathy contributed by diabetes and PAD.  He has known PAD which is worse in the left leg and this may be causing his asymmetric symptoms.  To further assess, I will check NCS/EMG of the legs. Recommend that he see a vascular specialist for PAD.  Patient educated on daily foot inspection, fall prevention, and safety precautions around the home.  Further recommendations pending results.  ------------------------------------------------------------- History of present illness: For the past 10 years, he has numbness and pain involving the feet, which is worse in the left leg.  Symptoms are constant and worse with activity.  He has numbness from the midfoot down into the toes on the right and from the ankle down into the toes on the left.  He has some weakness.  No low back pain or radicular leg pain.  He has known PAD, which is moderate in the left leg.  He is not seeing a vascular specialist.  He drinks 1-2 alcoholic beverages per month, no history of heavy alcohol  use.    Out-side paper records, electronic medical record, and images have been reviewed where available and summarized as:  Lab Results  Component Value Date   HGBA1C 6.7 (H) 10/01/2021    Past Medical History:   Diagnosis Date   A-fib (HCC)    Anemia due to blood loss 04/06/2020   Arthritis of ankle joint 11/20/2014   Arthritis of foot, left 06/26/2017   Added automatically from request for surgery [id number]   Arthrosis of left midfoot 06/26/2017   Benign lipomatous neoplasm of skin and subcutaneous tissue of head, face and neck 10/14/2021   Bilateral plantar fasciitis 01/26/2021   Bilateral shoulder pain    BPH (benign prostatic hyperplasia) 06/28/2017   Carpal tunnel syndrome, unspecified upper limb 11/14/2012   Cataracts, both eyes 08/13/2012   Chronic kidney disease    Chronic kidney disease, stage 3a (HCC) 10/14/2021   CKD (chronic kidney disease) stage 3, GFR 30-59 ml/min (HCC) 06/28/2017   [facility]   Diabetes mellitus without complication (HCC)    Diabetic neuropathy, painful (HCC) 04/06/2020   ED (erectile dysfunction) 04/06/2020   Encounter for removal of sutures 10/14/2021   Epidermal cyst 10/14/2021   Essential (primary) hypertension 06/28/2017   Essential hypertension with goal blood pressure less than 140/90 08/13/2012   Essential tremor 10/14/2021   Gastro-esophageal reflux disease without esophagitis 06/28/2017   GERD (gastroesophageal reflux disease)    Gout    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   Hernia, umbilical    Hiatal hernia    HLD (hyperlipidemia)    Hordeolum externum left upper eyelid 10/14/2021   Hypertensive chronic kidney disease with stage 1 through stage 4 chronic kidney disease, or unspecified chronic kidney disease 10/14/2021   Hypertensive chronic kidney disease with  stage 5 chronic kidney disease or end stage renal disease (HCC) 04/06/2020   Increased frequency of urination 04/06/2020   Ingrown nail 11/20/2014   Iron deficiency anemia    Lesion of ulnar nerve, left upper limb 10/14/2021   Need for prophylactic vaccination and inoculation against influenza 04/06/2020   Nephritis and nephropathy, 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 Normal RiskNov 19, 2014 Entered By: ADAMS,RICKEY D Comment: National PAVE Database   Pain in right knee 10/14/2021   Painful legs and moving toes 11/20/2014   Paroxysmal atrial fibrillation (HCC) 06/28/2017   Post-traumatic osteoarthritis of left knee 10/13/2021   Posterior tibial tendinitis, left leg 06/26/2017   Primary localized osteoarthrosis of ankle and foot 04/19/2006   Formatting of this note might be different from the original. 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/2021   Rotator cuff arthropathy of right shoulder 01/13/2021   SOB (shortness of breath) 12/19/2013   Tremor 04/06/2020   Type 2 diabetes mellitus (HCC) 06/28/2017   Type 2 diabetes mellitus with diabetic chronic kidney disease (HCC) 10/14/2021   Type 2 diabetes mellitus with hyperglycemia (HCC) 05/11/2020   Urinary tract infection, site not specified 10/14/2021   Vitamin D deficiency     Past Surgical History:  Procedure Laterality Date   CARPAL TUNNEL RELEASE Bilateral 2010   2016   CATARACT EXTRACTION Right 2021   FOOT FUSION Left 2018   KNEE ARTHROPLASTY Left 10/13/2021   Procedure: COMPUTER ASSISTED TOTAL KNEE ARTHROPLASTY;  Surgeon: Fidel Rogue, MD;  Location: [facility];  Service: Orthopedics;  Laterality: Left;  150   KNEE ARTHROSCOPY WITH PATELLA RECONSTRUCTION Left 1995   PENILE PROSTHESIS IMPLANT  2022   UMBILICAL HERNIA REPAIR  2000   x 2     Medications:   Outpatient Encounter Medications as of 02/21/2023  Medication Sig   allopurinol  (ZYLOPRIM ) 100 MG tablet Take 100 mg by mouth daily.   amLODipine  (NORVASC ) 10 MG tablet Take 10 mg by mouth daily.   apixaban  (ELIQUIS ) 5 MG TABS tablet Take 2.5 mg by mouth 2 (two) times daily.   Carboxymethylcellulose Sodium 0.25 % SOLN Place 1 drop into both eyes as needed (dry eyes).   colchicine  0.6 MG tablet Take 0.6 mg by mouth daily as needed (gout).   empagliflozin (JARDIANCE) 25 MG TABS tablet Take 12.5 mg by mouth daily.   ferrous sulfate 325 (65 FE) MG tablet Take 162.5 mg by mouth every Monday, Wednesday, and Friday.   finasteride  (PROSCAR ) 5 MG tablet Take 5 mg by mouth daily.   hydrALAZINE  (APRESOLINE ) 50 MG tablet Take 50 mg by mouth 2 (two) times daily.   insulin  glargine (LANTUS) 100 UNIT/ML injection Inject 20 Units into the skin at bedtime.   ketoconazole (NIZORAL) 2 % cream Apply 1 Application topically daily.   lisinopril (ZESTRIL) 40 MG tablet Take 40 mg by mouth daily.   metFORMIN (GLUCOPHAGE) 500 MG tablet Take 500 mg by mouth 2 (two) times daily with a meal.   metoprolol  tartrate (  LOPRESSOR ) 50 MG tablet Take 25 mg by mouth 2 (two) times daily.   omeprazole (PRILOSEC) 20 MG capsule Take 20 mg by mouth daily.   Semaglutide,0.25 or 0.5MG /DOS, 2 MG/1.5ML SOPN Inject 0.5 mg into the skin every Wednesday.   tamsulosin  (FLOMAX ) 0.4 MG CAPS capsule Take 0.4 mg by mouth daily.   trospium (SANCTURA) 20 MG tablet Take 20 mg by mouth 2 (two) times daily.   [DISCONTINUED] Finerenone  (KERENDIA ) 10 MG TABS Take 10 mg by mouth daily. (Patient not taking: Reported on 02/21/2023)   [DISCONTINUED] furosemide  (LASIX ) 20 MG tablet Take 20 mg by mouth daily. (Patient not taking: Reported on 02/21/2023)   [DISCONTINUED] gabapentin  (NEURONTIN ) 300 MG capsule Take 300 mg by mouth 3 (three) times daily. (Patient not taking: Reported on 02/21/2023)   Facility-Administered Encounter Medications as of 02/21/2023   Medication Note   betamethasone  acetate-betamethasone  sodium phosphate  (CELESTONE ) injection 12 mg 02/21/2023: Does injections every Wednesday.     Allergies:  Allergies  Allergen Reactions   Penicillin G Nausea And Vomiting   Pravastatin Nausea And Vomiting   Niacin Rash    Family History: Family History  Problem Relation Age of Onset   Other Mother        Organs failed   Brain cancer Father     Social History: Social History   Tobacco Use   Smoking status: Former    Current packs/day: 0.00    Average packs/day: 1 pack/day for 10.0 years (10.0 ttl pk-yrs)    Types: Cigarettes    Start date: 08/20/1959    Quit date: 08/19/1969    Years since quitting: 53.5   Smokeless tobacco: Never  Vaping Use   Vaping status: Never Used  Substance Use Topics   Alcohol  use: Yes    Alcohol /week: 1.0 standard drink of alcohol     Types: 1 Standard drinks or equivalent per week    Comment: Occasional Drink   Drug use: No   Social History   Social History Narrative   Are you right handed or left handed? Right Handed    Are you currently employed ? No    What is your current occupation?   Do you live at home alone? Yes   Who lives with you?    What type of home do you live in: 1 story or 2 story? Lives in a one story home         Vital Signs:  BP (!) 149/70   Pulse 74   Ht 5' 11 (1.803 m)   Wt 202 lb (91.6 kg)   SpO2 99%   BMI 28.17 kg/m    Neurological Exam: MENTAL STATUS including orientation to time, place, person, recent and remote memory, attention span and concentration, language, and fund of knowledge is normal.  Speech is not dysarthric.  CRANIAL NERVES: II:  No visual field defects.     III-IV-VI: Pupils equal round and reactive to light.  Normal conjugate, extra-ocular eye movements in all directions of gaze.  No nystagmus.  Mild right ptosis (old).   V:  Normal facial sensation.    VII:  Mild right facial asymmetry and movements (old) VIII:  Normal hearing and  vestibular function.   IX-X:  Normal palatal movement.   XI:  Normal shoulder shrug and head rotation.   XII:  Normal tongue strength and range of motion, no deviation or fasciculation.  MOTOR:  No atrophy, fasciculations or abnormal movements.  No pronator drift.   Upper Extremity:  Right  Left  Deltoid  5/5   5/5   Biceps  5/5   5/5   Triceps  5/5   5/5   Wrist extensors  5/5   5/5   Wrist flexors  5/5   5/5   Finger extensors  5/5   5/5   Finger flexors  5/5   5/5   Dorsal interossei  5/5   5/5   Abductor pollicis  5/5   5/5   Tone (Ashworth scale)  0  0   Lower Extremity:  Right  Left  Hip flexors  5/5   5/5   Knee flexors  5/5   5/5   Knee extensors  5/5   5/5   Dorsiflexors  5/5   5/5   Plantarflexors  5/5   5/5   Toe extensors  5/5   5/5   Toe flexors  5/5   5/5   Tone (Ashworth scale)  0  0   MSRs:                                           Right        Left brachioradialis 1+  1+  biceps 1+  1+  triceps 1+  1+  patellar 1+  1+  ankle jerk 0  0  Hoffman no  no  plantar response down  down   SENSORY:  Vibration is reduced at the knees, trace at the ankles (worse on the left) and absent at the great toe.  Pin prick and temperature reduced in the feet.   Romberg's sign present.   COORDINATION/GAIT: Normal finger-to- nose-finger.  Intact rapid alternating movements bilaterally.  Able to rise from a chair without using arms.  Gait is mildly wide-based, unassisted.  Unable to perform tandem gait.     Thank you for allowing me to participate in patient's care.  If I can answer any additional questions, I would be pleased to do so.    Sincerely,    Mellony Danziger K. Tobie, DO

## 2023-02-21 NOTE — Patient Instructions (Signed)
Nerve testing of the legs  ELECTROMYOGRAM AND NERVE CONDUCTION STUDIES (EMG/NCS) INSTRUCTIONS  How to Prepare The neurologist conducting the EMG will need to know if you have certain medical conditions. Tell the neurologist and other EMG lab personnel if you: Have a pacemaker or any other electrical medical device Take blood-thinning medications Have hemophilia, a blood-clotting disorder that causes prolonged bleeding Bathing Take a shower or bath shortly before your exam in order to remove oils from your skin. Don't apply lotions or creams before the exam.  What to Expect You'll likely be asked to change into a hospital gown for the procedure and lie down on an examination table. The following explanations can help you understand what will happen during the exam.  Electrodes. The neurologist or a technician places surface electrodes at various locations on your skin depending on where you're experiencing symptoms. Or the neurologist may insert needle electrodes at different sites depending on your symptoms.  Sensations. The electrodes will at times transmit a tiny electrical current that you may feel as a twinge or spasm. The needle electrode may cause discomfort or pain that usually ends shortly after the needle is removed. If you are concerned about discomfort or pain, you may want to talk to the neurologist about taking a short break during the exam.  Instructions. During the needle EMG, the neurologist will assess whether there is any spontaneous electrical activity when the muscle is at rest - activity that isn't present in healthy muscle tissue - and the degree of activity when you slightly contract the muscle.  He or she will give you instructions on resting and contracting a muscle at appropriate times. Depending on what muscles and nerves the neurologist is examining, he or she may ask you to change positions during the exam.  After your EMG You may experience some temporary, minor  bruising where the needle electrode was inserted into your muscle. This bruising should fade within several days. If it persists, contact your primary care doctor.   

## 2023-03-16 ENCOUNTER — Encounter: Payer: Self-pay | Admitting: Neurology

## 2023-03-16 ENCOUNTER — Encounter: Payer: No Typology Code available for payment source | Admitting: Neurology

## 2023-03-17 ENCOUNTER — Encounter: Payer: Self-pay | Admitting: Neurology

## 2023-03-17 ENCOUNTER — Ambulatory Visit (INDEPENDENT_AMBULATORY_CARE_PROVIDER_SITE_OTHER): Payer: No Typology Code available for payment source | Admitting: Neurology

## 2023-03-17 DIAGNOSIS — G629 Polyneuropathy, unspecified: Secondary | ICD-10-CM

## 2023-03-17 NOTE — Procedures (Signed)
[facility]  [address] [place], [address]  [place], [place] [address] Tel: [phone] Fax: [phone] Test Date:  03/17/2023  Patient: Eric Purtee Sr. DOB: February 07, 1941 Physician: Nita Sickle, DO  Sex: Male Height: 5\' 11"  Ref Phys: Zenda Alpers, MD  ID#: [id number]   Technician:    History: This is a 83 year old man referred for evaluation of bilateral feet and lower leg paresthesias.  NCV & EMG Findings: Extensive electrodiagnostic testing of the right lower extremity and additional studies of the left shows:  Bilateral superficial peroneal and sural sensory responses are absent. Bilateral peroneal motor responses are absent at the extensor digitorum brevis, and normal at the tibialis anterior.  Tibial motor response on the right is reduced (R1.7 mV), and absent on the left. Bilateral tibial H reflex studies are absent. Chronic motor axonal loss changes are seen affecting the muscles below the knee bilaterally, without accompanying active denervation.  Proximal and deep muscles were not tested as the patient is on anticoagulation therapy.  Impression: The electrophysiologic findings are consistent with a chronic and symmetric sensorimotor axonal polyneuropathy affecting the lower extremities.   ___________________________ Nita Sickle, DO    Nerve Conduction Studies   Stim Site NR Peak (ms) Norm Peak (ms) O-P Amp (V) Norm O-P Amp  Left Sup Peroneal Anti Sensory (Ant Lat Mall)  32 C  12 cm *NR  <4.6  >3  Right Sup Peroneal Anti Sensory (Ant Lat Mall)  32 C  12 cm *NR  <4.6  >3  Left Sural Anti Sensory (Lat Mall)  32 C  Calf *NR  <4.6  >3  Right Sural Anti Sensory (Lat Mall)  32 C  Calf *NR  <4.6  >3     Stim Site NR Onset (ms) Norm Onset (ms) O-P Amp (mV) Norm O-P Amp Site1 Site2 Delta-0 (ms) Dist (cm) Vel (m/s) Norm Vel (m/s)  Left Peroneal Motor (Ext Dig Brev)  32 C  Ankle *NR  <6.0  >2.5 B Fib Ankle  0.0  >40  B Fib *NR     Poplt B Fib  0.0   >40  Poplt *NR            Right Peroneal Motor (Ext Dig Brev)  32 C  Ankle *NR  <6.0  >2.5 B Fib Ankle  0.0  >40  B Fib *NR     Poplt B Fib  0.0  >40  Poplt *NR            Left Peroneal TA Motor (Tib Ant)  32 C  Fib Head    4.4 <4.5 3.3 >3 Poplit Fib Head 1.1 7.0 64 >40  Poplit    5.5 <5.7 3.3         Right Peroneal TA Motor (Tib Ant)  32 C  Fib Head    3.4 <4.5 3.8 >3 Poplit Fib Head 1.5 8.0 53 >40  Poplit    4.9 <5.7 3.6         Left Tibial Motor (Abd Hall Brev)  32 C  Ankle *NR  <6.0  >4 Knee Ankle  0.0  >40  Knee *NR            Right Tibial Motor (Abd Hall Brev)  32 C  Ankle    5.6 <6.0 *1.7 >4 Knee Ankle 11.2 43.0 *38 >40  Knee    16.8  1.6          Electromyography   Side Muscle Ins.Act Fibs Fasc Recrt Amp  Dur Poly Activation Comment  Right AntTibialis Nml Nml Nml *1- *1+ *1+ *1+ Nml N/A  Right Gastroc Nml Nml Nml *1- *1+ *1+ *1+ Nml N/A  Right RectFemoris Nml Nml Nml Nml Nml Nml Nml Nml N/A  Right BicepsFemS Nml Nml Nml Nml Nml Nml Nml Nml N/A  Left AntTibialis Nml Nml Nml *1- *1+ *1+ *1+ Nml N/A  Left Gastroc Nml Nml Nml *2- *1+ *1+ *1+ Nml N/A  Left RectFemoris Nml Nml Nml Nml Nml Nml Nml Nml N/A  Left BicepsFemS Nml Nml Nml Nml Nml Nml Nml Nml N/A      Waveforms:

## 2023-08-23 ENCOUNTER — Other Ambulatory Visit: Payer: Self-pay | Admitting: Family

## 2023-08-23 DIAGNOSIS — F1021 Alcohol dependence, in remission: Secondary | ICD-10-CM

## 2023-08-28 ENCOUNTER — Other Ambulatory Visit

## 2023-09-11 ENCOUNTER — Other Ambulatory Visit: Payer: Self-pay

## 2023-09-11 DIAGNOSIS — M79604 Pain in right leg: Secondary | ICD-10-CM

## 2023-09-14 ENCOUNTER — Other Ambulatory Visit: Payer: Self-pay | Admitting: Family

## 2023-09-14 DIAGNOSIS — F1021 Alcohol dependence, in remission: Secondary | ICD-10-CM

## 2023-09-20 ENCOUNTER — Ambulatory Visit
Admission: RE | Admit: 2023-09-20 | Discharge: 2023-09-20 | Disposition: A | Discharge status: Home / Self Care | Source: Ambulatory Visit | Attending: Family | Admitting: Family

## 2023-09-20 DIAGNOSIS — F1021 Alcohol dependence, in remission: Secondary | ICD-10-CM

## 2023-09-22 ENCOUNTER — Encounter: Payer: Self-pay | Admitting: Gastroenterology

## 2023-10-04 ENCOUNTER — Ambulatory Visit (HOSPITAL_COMMUNITY)
Admission: RE | Admit: 2023-10-04 | Discharge: 2023-10-04 | Disposition: A | Discharge status: Home / Self Care | Source: Ambulatory Visit | Attending: Vascular Surgery | Admitting: Vascular Surgery

## 2023-10-04 ENCOUNTER — Encounter: Payer: Self-pay | Admitting: Vascular Surgery

## 2023-10-04 ENCOUNTER — Ambulatory Visit: Attending: Vascular Surgery | Admitting: Vascular Surgery

## 2023-10-04 VITALS — BP 148/76 | HR 62 | Temp 97.6°F | Resp 18 | Ht 71.0 in | Wt 205.1 lb

## 2023-10-04 DIAGNOSIS — M79604 Pain in right leg: Secondary | ICD-10-CM | POA: Insufficient documentation

## 2023-10-04 DIAGNOSIS — M79605 Pain in left leg: Secondary | ICD-10-CM | POA: Diagnosis present

## 2023-10-04 DIAGNOSIS — I70212 Atherosclerosis of native arteries of extremities with intermittent claudication, left leg: Secondary | ICD-10-CM | POA: Diagnosis not present

## 2023-10-04 LAB — VAS US ABI WITH/WO TBI
Left ABI: 0.64
Right ABI: 1.02

## 2023-10-04 NOTE — Progress Notes (Signed)
 Patient ID: Eric Vantol Sr., male   DOB: 1940-11-07, 83 y.o.   MRN: [id number]  Reason for Consult: New Patient (Initial Visit)   Referred by Venson Candis CROME, NP  Subjective:     HPI:  Eric Gheen Sr. is a 83 y.o. male without significant history of vascular disease.  He does have diabetes, hypertension, hyperlipidemia and chronic kidney disease.  He states that he has pain in both of his legs most recently just started in the right foot.  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  He does not have tissue loss or ulceration.  No previous vascular interventions of his lower extremities.  Denies history of stroke, TIA or amaurosis and no personal or family history of aneurysm disease.  Past Medical History:  Diagnosis Date   A-fib (HCC)    Anemia due to blood loss 04/06/2020   Arthritis of ankle joint 11/20/2014   Arthritis of foot, left 06/26/2017   Added automatically from request for surgery [id number]   Arthrosis of left midfoot 06/26/2017   Benign lipomatous neoplasm of skin and subcutaneous tissue of head, face and neck 10/14/2021   Bilateral plantar fasciitis 01/26/2021   Bilateral shoulder pain    BPH (benign prostatic hyperplasia) 06/28/2017   Carpal tunnel syndrome, unspecified upper limb 11/14/2012   Cataracts, both eyes 08/13/2012   Chronic kidney disease    Chronic kidney disease, stage 3a (HCC) 10/14/2021   CKD (chronic kidney disease) stage 3, GFR 30-59 ml/min (HCC) 06/28/2017   [facility]   Diabetes mellitus without complication (HCC)    Diabetic neuropathy, painful (HCC) 04/06/2020   ED (erectile dysfunction) 04/06/2020   Encounter for removal of sutures 10/14/2021   Epidermal cyst 10/14/2021   Essential (primary) hypertension 06/28/2017   Essential hypertension with goal blood pressure less than 140/90 08/13/2012   Essential tremor 10/14/2021   Gastro-esophageal reflux disease without esophagitis 06/28/2017   GERD  (gastroesophageal reflux disease)    Gout    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   Hernia, umbilical    Hiatal hernia    HLD (hyperlipidemia)    Hordeolum externum left upper eyelid 10/14/2021   Hypertensive chronic kidney disease with stage 1 through stage 4 chronic kidney disease, or unspecified chronic kidney disease 10/14/2021   Hypertensive chronic kidney disease with stage 5 chronic kidney disease or end stage renal disease (HCC) 04/06/2020   Increased frequency of urination 04/06/2020   Ingrown nail 11/20/2014   Iron deficiency anemia    Lesion of ulnar nerve, left upper limb 10/14/2021   Need for prophylactic vaccination and inoculation against influenza 04/06/2020   Nephritis and nephropathy, 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/2021   Rotator cuff arthropathy of right shoulder 01/13/2021   SOB (shortness of breath) 12/19/2013   Tremor  04/06/2020   Type 2 diabetes mellitus (HCC) 06/28/2017   Type 2 diabetes mellitus with diabetic chronic kidney disease (HCC) 10/14/2021   Type 2 diabetes mellitus with hyperglycemia (HCC) 05/11/2020   Urinary tract infection, site not specified 10/14/2021   Vitamin D deficiency    Family History  Problem Relation Age of Onset   Other Mother        Organs failed   Brain cancer Father    Past Surgical History:  Procedure Laterality Date   CARPAL TUNNEL RELEASE Bilateral 2010   2016   CATARACT EXTRACTION Right 2021   FOOT FUSION Left 2018   KNEE ARTHROPLASTY Left 10/13/2021   Procedure: COMPUTER ASSISTED TOTAL KNEE ARTHROPLASTY;  Surgeon: Fidel Rogue, MD;  Location: [facility];  Service: Orthopedics;  Laterality: Left;  150   KNEE ARTHROSCOPY WITH PATELLA RECONSTRUCTION Left 1995   PENILE PROSTHESIS IMPLANT  2022   UMBILICAL HERNIA REPAIR  2000   x 2    Short Social History:  Social History   Tobacco Use   Smoking status: Former    Current packs/day: 0.00    Average packs/day: 1 pack/day for 10.0 years (10.0 ttl pk-yrs)    Types: Cigarettes    Start date: 08/20/1959    Quit date: 08/19/1969    Years since quitting: 54.1   Smokeless tobacco: Never  Substance Use Topics   Alcohol  use: Yes    Alcohol /week: 1.0 standard drink of alcohol     Types: 1 Standard drinks or equivalent per week    Comment: Occasional Drink    Allergies  Allergen Reactions   Penicillin G Nausea And Vomiting   Pravastatin Nausea And Vomiting   Niacin Rash    Current Outpatient Medications  Medication Sig Dispense Refill   allopurinol  (ZYLOPRIM ) 100 MG tablet Take 100 mg by mouth daily.     amLODipine  (NORVASC ) 10 MG tablet Take 10 mg by mouth daily.     apixaban  (ELIQUIS ) 5 MG TABS tablet Take 2.5 mg by mouth 2 (two) times daily.     Carboxymethylcellulose Sodium 0.25 % SOLN Place 1 drop into both eyes as needed (dry eyes).     colchicine  0.6 MG tablet Take 0.6 mg by mouth daily as needed (gout).      empagliflozin (JARDIANCE) 25 MG TABS tablet Take 12.5 mg by mouth daily.     ferrous sulfate 325 (65 FE) MG tablet Take 162.5 mg by mouth every Monday, Wednesday, and Friday.     finasteride  (PROSCAR ) 5 MG tablet Take 5 mg by mouth daily.     hydrALAZINE  (APRESOLINE ) 50 MG tablet Take 50 mg by mouth 2 (two) times daily.     insulin  glargine (LANTUS) 100 UNIT/ML injection Inject 20 Units into the skin at bedtime.     ketoconazole (NIZORAL) 2 % cream Apply 1 Application topically daily.     lisinopril (ZESTRIL) 40 MG tablet Take 40 mg by mouth daily.     metFORMIN (GLUCOPHAGE) 500 MG tablet Take 500 mg by mouth 2 (two) times daily with a meal.     metoprolol  tartrate (LOPRESSOR ) 50 MG tablet Take 25 mg by mouth 2 (two)  times daily.     omeprazole (PRILOSEC) 20 MG capsule Take 20 mg by mouth daily.     Semaglutide,0.25 or 0.5MG /DOS, 2 MG/1.5ML SOPN Inject 0.5 mg into the skin every Wednesday.     tamsulosin  (FLOMAX ) 0.4 MG CAPS capsule Take 0.4 mg by mouth daily.     trospium (SANCTURA) 20 MG tablet Take 20 mg by mouth 2 (two) times daily.     Current Facility-Administered Medications  Medication Dose Route Frequency Provider Last Rate Last Admin   betamethasone  acetate-betamethasone  sodium phosphate  (CELESTONE ) injection 12 mg  12 mg Other Once Price, Michael J, DPM        Review of Systems  Constitutional:  Constitutional negative. HENT: HENT negative.  Eyes: Eyes negative.  Respiratory: Respiratory negative.  Cardiovascular: Cardiovascular negative.  GI: Gastrointestinal negative.  Musculoskeletal: Musculoskeletal negative.  Skin: Skin negative.  Neurological: Neurological negative. Hematologic: Hematologic/lymphatic negative.  Psychiatric: Psychiatric negative.        Objective:  Objective   Vitals:   10/04/23 0843  BP: (!) 148/76  Pulse: 62  Resp: 18  Temp: 97.6 F (36.4 C)  TempSrc: Temporal  SpO2: 99%  Weight: 205 lb 1.6 oz (93 kg)  Height: 5' 11 (1.803 m)   Body  mass index is 28.61 kg/m.  Physical Exam HENT:     Head: Normocephalic.     Nose: Nose normal.     Mouth/Throat:     Mouth: Mucous membranes are moist.  Eyes:     Pupils: Pupils are equal, round, and reactive to light.  Neck:     Vascular: No carotid bruit.  Cardiovascular:     Rate and Rhythm: Normal rate.     Pulses:          Femoral pulses are 2+ on the right side and 2+ on the left side.      Popliteal pulses are 2+ on the right side and 0 on the left side.       Dorsalis pedis pulses are 2+ on the right side.       Posterior tibial pulses are 2+ on the right side.     Heart sounds: No murmur heard. Pulmonary:     Effort: Pulmonary effort is normal.  Abdominal:     General: Abdomen is flat.  Musculoskeletal:     Right lower leg: No edema.     Left lower leg: No edema.  Skin:    General: Skin is warm and dry.     Capillary Refill: Capillary refill takes less than 2 seconds.  Neurological:     General: No focal deficit present.     Mental Status: He is alert.  Psychiatric:        Mood and Affect: Mood normal.     Data: ABI Findings:  +---------+------------------+-----+---------+--------+  Right   Rt Pressure (mmHg)IndexWaveform Comment   +---------+------------------+-----+---------+--------+  Brachial 163                                       +---------+------------------+-----+---------+--------+  PTA     176               1.02 biphasic           +---------+------------------+-----+---------+--------+  DP      159               0.92 triphasic          +---------+------------------+-----+---------+--------+  Great Toe147               0.85                    +---------+------------------+-----+---------+--------+   +---------+------------------+-----+----------+-------+  Left    Lt Pressure (mmHg)IndexWaveform  Comment  +---------+------------------+-----+----------+-------+  Brachial 173                                        +---------+------------------+-----+----------+-------+  PTA     105               0.61 biphasic           +---------+------------------+-----+----------+-------+  DP      110               0.64 monophasic         +---------+------------------+-----+----------+-------+  Great Toe96                0.55                    +---------+------------------+-----+----------+-------+   +-------+-----------+-----------+------------+------------+  ABI/TBIToday's ABIToday's TBIPrevious ABIPrevious TBI  +-------+-----------+-----------+------------+------------+  Right 1.02       0.85                                 +-------+-----------+-----------+------------+------------+  Left  0.64       0.55                                 +-------+-----------+-----------+------------+------------+           Summary:  Right: Resting right ankle-brachial index is within normal range. The  right toe-brachial index is normal.   Left: Resting left ankle-brachial index indicates moderate left lower  extremity arterial disease. The left toe-brachial index is abnormal.      Assessment/Plan:    83 year old male with history as above with moderately depressed ABI on the left with pain consistent with claudication although is only mildly life-limiting at this time.  Have discussed with him continued walking and follow-up in 6 months with left lower extremity duplex and ABIs.  He needs diligent protection of his left foot to prevent wounds.  We discussed that he is at low risk (approximately 5%) of major amputation during his lifetime.     Penne Lonni Colorado MD [facility]

## 2023-10-05 ENCOUNTER — Other Ambulatory Visit: Payer: Self-pay

## 2023-10-05 DIAGNOSIS — I70212 Atherosclerosis of native arteries of extremities with intermittent claudication, left leg: Secondary | ICD-10-CM

## 2023-10-06 NOTE — ED Provider Notes (Signed)
 [facility]  ED Provider Note  Eric Bray 83 y.o. male DOB: 1940-05-29 MRN: [id number] History   Chief Complaint  Patient presents with  . Abdominal Pain    RLQ pain for the last month, had 2 hernia repairs last year. Denies N/V, does have looser bowels but not diarrhea.    Eric Bray presents to the ED with right sided abdominal pain.  Pain is in his right lower quadrant and does not radiate.  He has had it intermittently for a few weeks, but it has been worse over the past few days.  He has also had several days of loose stools periodically over that same time.Eric Bray  He has urinary frequency, and this is his baseline.  He denies any fevers or chills.  He does have a history of prior surgical repair of hernias.  He has noticed some fullness on his right side with respect to his left.   History provided by:  Patient Abdominal Pain      History reviewed. No pertinent past medical history.  No past surgical history on file.  Social History   Substance and Sexual Activity  Alcohol  Use None   Tobacco Use History[1] E-Cigarettes  . Vaping Use    . Start Date    . Cartridges/Day    . Quit Date     Social History   Substance and Sexual Activity  Drug Use Not on file         Allergies[2]  Home Medications   ALLOPURINOL  (ZYLOPRIM ) 100 MG TABLET    Take one tablet (100 mg dose) by mouth 2 (two) times daily.   AMLODIPINE  BESYLATE (NORVASC ) 10 MG TABLET    Take one tablet (10 mg dose) by mouth.   APIXABAN  (ELIQUIS ) 5 MG TABLET    Take 2.5 mg by mouth daily.   CHOLECALCIFEROL (VITAMIN D-1000 MAX ST) 1,000 UNITS (25 MCG) TABLET    Take one tablet (1,000 Units dose) by mouth daily.   COLCHICINE  0.6 MG TABLET    Take 0.6 mg by mouth.   CYANOCOBALAMIN  250 MCG TABLET    Take one tablet (250 mcg dose) by mouth 3 (three) times a week.   FERROUS SULFATE (FERROUS SULFATE) 325 (65 FE) MG TABLET    Take 325 mg by mouth.   FINASTERIDE  (PROSCAR ) 5 MG  TABLET    Take one tablet (5 mg dose) by mouth.   FUROSEMIDE  (LASIX ) 20 MG TABLET    Take 20 mg by mouth.   GABAPENTIN  (NEURONTIN ) 300 MG CAPSULE    Take 300 mg by mouth.   HYDRALAZINE  HCL (APRESOLINE ) 50 MG TABLET    Take one tablet (50 mg dose) by mouth.   HYDROCHLOROTHIAZIDE (HYDRODIURIL) 25 MG TABLET    Take 25 mg by mouth.   INSULIN  GLARGINE (LANTUS) 100 UNIT/ML INJECTION    Inject twenty Units into the skin at bedtime.   LISINOPRIL (PRINIVIL,ZESTRIL) 40 MG TABLET    Take one tablet (40 mg dose) by mouth daily.   METFORMIN (GLUCOPHAGE) 500 MG TABLET    Take 500-1,000 mg by mouth.   OMEPRAZOLE (PRILOSEC) 20 MG CAPSULE    Take one capsule (20 mg dose) by mouth.   OZEMPIC, 0.25 OR 0.5 MG/DOSE, 2 MG/1.5ML SOPN PEN    Inject 0.5 mg into the skin once a week at 0900.   ROSUVASTATIN CALCIUM (CRESTOR) 40 MG TABLET    Take 20 mg by mouth.   TAMSULOSIN  (FLOMAX ) 0.4 MG CAPS  Take one capsule (0.4 mg dose) by mouth.   TROSPIUM (SANCTURA) 20 MG TABLET    Take one tablet (20 mg dose) by mouth as needed (AS NEEDED).    Primary Survey  Primary Survey  Review of Systems   Review of Systems  Gastrointestinal:  Positive for abdominal pain.    Physical Exam   ED Triage Vitals  BP 10/06/23 1018 135/78  Heart Rate 10/06/23 1018 70  Resp 10/06/23 1133 18  SpO2 10/06/23 1018 98 %  Temp 10/06/23 1019 97.6 F (36.4 C)    Physical Exam  Nursing note and vitals reviewed. Constitutional: He appears well-developed and well-nourished. He no respiratory distress.  HENT:  Head: Normocephalic and atraumatic.  Eyes: Conjunctivae are normal. Right eye: no drainage. no conjunctival injection. Left eye: no drainage. no conjunctival injection.  Pulmonary/Chest: No respiratory distress. Respiratory effort normal.  Abdominal: Soft. There is mild abdominal tenderness in the right lower quadrant. Abdomen not distended.  hernia is not present. There is no palpable pulsatile mass.  Musculoskeletal: No obvious  deformity noted to extremities. no edema.  Neurological: He is alert and oriented to person, place, and time. Moves all extremities equally. He has normal speech.  Skin: Skin is warm. Skin is dry.  Psychiatric: He has a normal mood and affect. His behavior is normal.     ED Course   Lab results:   CBC AND DIFFERENTIAL - Abnormal      Result Value   WBC 5.0     RBC 5.13     HGB 15.3     HCT 47.2     MCV 92.0     MCH 29.8     MCHC 32.4     Plt Ct 153     RDW SD 47.3 (*)    MPV 11.2     NRBC% 0.0     Absolute NRBC Count 0.00     NEUTROPHIL % 58.5     LYMPHOCYTE % 29.7     MONOCYTE % 9.8     Eosinophil % 1.4     BASOPHIL % 0.4     IG% 0.2     ABSOLUTE NEUTROPHIL COUNT 2.92     ABSOLUTE LYMPHOCYTE COUNT 1.48     Absolute Monocyte Count 0.49     Absolute Eosinophil Count 0.07     Absolute Basophil Count 0.02     Absolute Immature Granulocyte Count 0.01    COMPREHENSIVE METABOLIC PANEL - Abnormal   Na 140     Potassium 4.4     Cl 102     CO2 26     AGAP 12     Glucose 114 (*)    BUN 20     Creatinine 1.62 (*)    Ca 9.8     ALK PHOS 111     T Bili 0.4     Total Protein 6.2     Alb 3.9     GLOBULIN 2.3     ALBUMIN/GLOBULIN RATIO 1.7     BUN/CREAT RATIO 12.3     ALT 11     AST 22     eGFR 42 (*)    Comment: Normal GFR (glomerular filtration rate) > 60 mL/min/1.73 meters squared, < 60 may include impaired kidney function. Calculation based on the Chronic Kidney Disease Epidemiology Collaboration (CK-EPI)equation refit without adjustment for race.  URINALYSIS W/MICRO REFLEX CULTURE - SYMPTOMATIC - Abnormal   Urine Color Yellow     Urine Clarity Clear  Urine Specific Gravity 1.014     Comment: Specific gravity not compensated for elevated protein and/or glucose due to over-range (>) results.   Urine pH 6.0     Urine Protein - Dipstick 100 (*)    Urine Glucose >1000 (*)    Urine Ketones Negative     Urine Bilirubin Negative     Urine Blood 0.03 (*)    Urine  Nitrite Negative     Urine Urobilinogen <2     Urine Leukocyte Esterase Negative     Urine Squamous Epithelial Cells 0-2     Urine WBC 0-2     Urine RBC 0-2     Urine Bacteria None     UA Microscopic Yes Micro (*)    Narrative:    Does not meet criteria for reflex to Urine Culture.  LIPASE - Normal   Lipase 30    LIGHT BLUE TOP  GOLD SST    Imaging:   CT ABDOMEN PELVIS W IV CONTRAST   Narrative:    CT ABDOMEN AND PELVIS WITH CONTRAST  INDICATION: Abdominal Pain  COMPARISON:  None.    TECHNIQUE:  CT imaging of the abdomen and pelvis was performed after the intravenous administration of 60 mL of Isovue-370 iodinated contrast. Dose reduction was utilized (automated exposure control, mA or kV adjustment based on patient size, or iterative image reconstruction). Coronal and sagittal reformatted images were generated and reviewed.  FINDINGS:  LOWER CHEST: Mediastinum: Small hiatal hernia. Heart/vessels: Normal size.  No pericardial effusion. Heavy coronary artery calcification. Lungs and Pleura: No focal airspace consolidation.  No pleural effusion or pneumothorax.  . ABDOMEN: Liver: Within normal limits.    Gallbladder/biliary: Stones are present within the gallbladder. No pericholecystic inflammatory changes.   No intrahepatic or extrahepatic biliary duct dilation. Spleen: Within normal limits.  Pancreas: Within normal limits.  Adrenals: Within normal limits.  Kidneys: Symmetric enhancement. Lobulated contour of the kidneys, suggesting areas of scarring. Multiple bilateral low-attenuation cysts with the largest measuring 4.7 cm, for which no specific imaging follow-up is needed. Negative for hydronephrosis or renal calculi. Peritoneum/Mesenteries/Extraperitoneum: No free air.  No significant free intraperitoneal fluid.  No pathologically enlarged lymph nodes.  Gastrointestinal tract: Scattered colonic diverticula.  Normal appendix. No evidence for bowel obstruction. Vascular:  Extensive atherosclerotic calcifications involving the abdominal aorta and its branches without aneurysmal dilatation.  Eric Bray PELVIS: Ureters: Within normal limits.  Bladder: Mild thickening of the bladder wall with surrounding inflammatory stranding.  Reproductive System: Partially imaged penile prosthesis with an inflated reservoir in the right anterior pelvis and a decompressed reservoir in the left anterior pelvis.  . MSK: No acute osseous abnormality or aggressive osseous lesion identified. Right iliac bone island. Status post right total hip arthroplasty with associated streak artifact in the pelvis.     Impression:    IMPRESSION:  1.  Partially imaged penile prosthesis with an inflated reservoir in the right anterior pelvis and a decompressed reservoir in the left anterior pelvis. This may be contributing to the patient's sensation of right pelvic fullness.  2.  Mild bladder wall thickening with mild surrounding inflammatory stranding. Correlation with urinalysis is recommended to exclude a urinary tract infection. 3.  Normal caliber of the appendix without surrounding inflammatory changes.  4.  Multiple additional ancillary findings as above.  Electronically Signed by: Duwaine Ruth on 10/06/2023 12:19 PM     ECG: ECG Results   None  Pre-Sedation Procedures    Medical Decision Making This patient presented with right lower quadrant pain that had been worsening for several weeks.  He had a reassuring workup but does have asymmetric distention of the penile reservoir.  This appears to be contributing to his symptoms.  I do think he is suitable for outpatient management and have recommended that he follow-up with urology.  Problems Addressed: Other mechanical complication of implanted penile prosthesis, initial encounter: acute illness or injury with systemic symptoms Right  lower quadrant abdominal pain: acute illness or injury with systemic symptoms  Amount and/or Complexity of Data Reviewed External Data Reviewed: notes.    Details: Outpatient notes Labs: ordered. Decision-making details documented in ED Course. Radiology: ordered and independent interpretation performed. Decision-making details documented in ED Course.    Details: No bowel obstruction on CT  Risk Prescription drug management.             Provider Communication  New Prescriptions   No medications on file    Modified Medications   No medications on file    Discontinued Medications   No medications on file    Clinical Impression Final diagnoses:  Right lower quadrant abdominal pain  Other mechanical complication of implanted penile prosthesis, initial encounter    ED Disposition     ED Disposition  Discharge   Condition  Stable   Comment  --                 Follow-up Information     [facility].   Contact information: [address] [place]  [address] [phone]                 Electronically signed by:       [1] Social History Tobacco Use  Smoking Status Former  Smokeless Tobacco Never  [2] Allergies Allergen Reactions  . Atorvastatin Nausea And Vomiting  . Niacin Rash  . Penicillin G Nausea And Vomiting  . Pravastatin Nausea And Vomiting   Therisa KANDICE Silvan, MD 10/06/23 1257

## 2023-11-09 ENCOUNTER — Encounter: Payer: Self-pay | Admitting: Gastroenterology

## 2023-11-09 ENCOUNTER — Ambulatory Visit (INDEPENDENT_AMBULATORY_CARE_PROVIDER_SITE_OTHER): Admitting: Gastroenterology

## 2023-11-09 ENCOUNTER — Ambulatory Visit (INDEPENDENT_AMBULATORY_CARE_PROVIDER_SITE_OTHER)
Admission: RE | Admit: 2023-11-09 | Discharge: 2023-11-09 | Disposition: A | Discharge status: Home / Self Care | Source: Ambulatory Visit | Attending: Gastroenterology | Admitting: Gastroenterology

## 2023-11-09 ENCOUNTER — Other Ambulatory Visit (INDEPENDENT_AMBULATORY_CARE_PROVIDER_SITE_OTHER)

## 2023-11-09 VITALS — BP 130/70 | HR 85 | Ht 71.5 in | Wt 201.0 lb

## 2023-11-09 DIAGNOSIS — R140 Abdominal distension (gaseous): Secondary | ICD-10-CM | POA: Diagnosis not present

## 2023-11-09 DIAGNOSIS — R194 Change in bowel habit: Secondary | ICD-10-CM | POA: Diagnosis not present

## 2023-11-09 LAB — C-REACTIVE PROTEIN: CRP: <0.5 mg/dL (ref 0.5–20.0)

## 2023-11-09 LAB — TSH: TSH: 0.86 u[IU]/mL (ref 0.35–5.50)

## 2023-11-09 NOTE — Patient Instructions (Addendum)
 Your provider has requested that you go to the basement level for lab work before leaving today. Press B on the elevator. The lab is located at the first door on the left as you exit the elevator.  Your provider has requested that you have an abdominal x ray before leaving today. Please go to the basement floor to our Radiology department for the test.   Your provider has ordered Diatherix stool testing for you. You have received a kit from our office today containing all necessary supplies to complete this test. Please carefully read the stool collection instructions provided in the kit before opening the accompanying materials. In addition, be sure there is a label providing your full name and date of birth on the puritan opti-swab tube that is supplied in the kit (if you do not see a label with this information on your test tube, please make us  aware before test collection!). After completing the test, you should secure the purtian tube into the specimen biohazard bag. The [facility] E-Req sheet (including date and time of specimen collection) should be placed into the outside pocket of the specimen biohazard bag and returned to the [place] lab (basement floor of [facility]) within 3 days of collection. Please make sure to give the specimen to a staff member at the lab. DO NOT leave the specimen on the counter.   If the specimen date and time (can be found in the upper right boxed portion of the sheet) are not filled out on the E-Req sheet, the test will NOT be performed.   _______________________________________________________  If your blood pressure at your visit was 140/90 or greater, please contact your primary care physician to follow up on this.  _______________________________________________________  If you are age 71 or older, your body mass index should be between 23-30. Your Body mass index is 27.64 kg/m. If this is out of the aforementioned range  listed, please consider follow up with your Primary Care Provider.  If you are age 78 or younger, your body mass index should be between 19-25. Your Body mass index is 27.64 kg/m. If this is out of the aformentioned range listed, please consider follow up with your Primary Care Provider.   ________________________________________________________  The [facility] providers would like to encourage you to use MYCHART to communicate with providers for non-urgent requests or questions.  Due to long hold times on the telephone, sending your provider a message by [facility] may be a faster and more efficient way to get a response.  Please allow 48 business hours for a response.  Please remember that this is for non-urgent requests.  _______________________________________________________  [facility] is using a team-based approach to care.  Your team is made up of your doctor and two to three APPS. Our APPS (Nurse Practitioners and Physician Assistants) work with your physician to ensure care continuity for you. They are fully qualified to address your health concerns and develop a treatment plan. They communicate directly with your gastroenterologist to care for you. Seeing the Advanced Practice Practitioners on your physician's team can help you by facilitating care more promptly, often allowing for earlier appointments, access to diagnostic testing, procedures, and other specialty referrals.   Thank you for trusting me with your gastrointestinal care. Deanna May, FNP-C

## 2023-11-09 NOTE — Progress Notes (Signed)
 Chief Complaint: bloating, altered  BM's Primary GI Doctor: Dr. Charlanne  HPI:  Patient is a  83  year old male patient with past medical history of DM type 2, CKD stage 3, GERD, and A- fib, who was self referred for a evaluation of bloating, altered bowel habits .    Interval History     Patient presents for evaluation of boating. He reports the bloating seems to have started after he had umbilical hernia surgery last April. He notes the bloating is not a daily thing. He has bowel movement every 1-2 days. He reports the bowel movements are urgent. At times he is unsure if it is gas or he will pass stool.He notes the stools are typically loose. No blood in stool.  He reports OTC Pepto helps with the symptoms.   No new medications. No recent travel.  He is on three diabetes medications, reports none of them are new.   He also notes odorous burps.    Patient has history of GERD and currently taking omeprazole 20 mg po daily. Patient denies dysphagia. Patient denies nausea, vomiting, or weight loss.  He drinks occassional wine. Former smoker, stopped 50 years ago.   Patient on Eliquis  twice daily for afib. He is not seeing cardiologist. Medications managed by his PCP. No chest pain.   Patients last colonoscopy was 10-11 years ago. He reports he has had 2-3 and all normal. He had them done at [facility] in [place] .   Patient's family history: no colon CA, no esophageal CA  Wt Readings from Last 3 Encounters:  11/09/23 201 lb (91.2 kg)  10/04/23 205 lb 1.6 oz (93 kg)  02/21/23 202 lb (91.6 kg)    Past Medical History:  Diagnosis Date   A-fib (HCC)    Anemia due to blood loss 04/06/2020   Arthritis of ankle joint 11/20/2014   Arthritis of foot, left 06/26/2017   Added automatically from request for surgery [id number]   Arthrosis of left midfoot 06/26/2017   Benign lipomatous neoplasm of skin and subcutaneous tissue of head, face and neck 10/14/2021   Bilateral plantar fasciitis 01/26/2021    Bilateral shoulder pain    BPH (benign prostatic hyperplasia) 06/28/2017   Carpal tunnel syndrome, unspecified upper limb 11/14/2012   Cataracts, both eyes 08/13/2012   Chronic kidney disease    Chronic kidney disease, stage 3a (HCC) 10/14/2021   CKD (chronic kidney disease) stage 3, GFR 30-59 ml/min (HCC) 06/28/2017   [facility]   Diabetes mellitus without complication (HCC)    Diabetic neuropathy, painful (HCC) 04/06/2020   ED (erectile dysfunction) 04/06/2020   Encounter for removal of sutures 10/14/2021   Epidermal cyst 10/14/2021   Essential (primary) hypertension 06/28/2017   Essential hypertension with goal blood pressure less than 140/90 08/13/2012   Essential tremor 10/14/2021   Gastro-esophageal reflux disease without esophagitis 06/28/2017   GERD (gastroesophageal reflux disease)    Gout    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   Hernia, umbilical    Hiatal hernia    HLD (hyperlipidemia)    Hordeolum externum left upper eyelid 10/14/2021   Hypertensive chronic kidney disease with stage 1 through stage 4 chronic kidney disease, or unspecified chronic kidney disease 10/14/2021   Hypertensive chronic kidney disease with stage 5 chronic kidney disease or end stage renal disease (HCC) 04/06/2020   Increased frequency of urination 04/06/2020   Ingrown nail 11/20/2014   Iron deficiency anemia    Lesion  of ulnar nerve, left upper limb 10/14/2021   Need for prophylactic vaccination and inoculation against influenza 04/06/2020   Nephritis and nephropathy, 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 Normal RiskNov 19, 2014 Entered By: ADAMS,RICKEY D Comment: National PAVE Database   Pain in right knee 10/14/2021   Painful legs and moving toes 11/20/2014   Paroxysmal atrial fibrillation (HCC) 06/28/2017   Post-traumatic osteoarthritis of left knee 10/13/2021   Posterior tibial tendinitis, left leg 06/26/2017   Primary localized osteoarthrosis of ankle and foot 04/19/2006   Formatting of this note might be different from the original. 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/2021   Rotator cuff arthropathy of right shoulder 01/13/2021   SOB (shortness of breath) 12/19/2013   Tremor 04/06/2020   Type 2 diabetes mellitus (HCC) 06/28/2017   Type 2 diabetes mellitus with diabetic chronic kidney disease (HCC) 10/14/2021   Type 2 diabetes mellitus with hyperglycemia (HCC) 05/11/2020   Urinary tract infection, site not specified 10/14/2021   Vitamin D deficiency     Past Surgical History:  Procedure Laterality Date   CARPAL TUNNEL RELEASE Bilateral 2010   2016   CATARACT EXTRACTION Right 2021   FOOT FUSION Left 2018   KNEE ARTHROPLASTY Left 10/13/2021   Procedure: COMPUTER ASSISTED TOTAL KNEE ARTHROPLASTY;  Surgeon: Fidel Rogue, MD;  Location: [facility];  Service: Orthopedics;  Laterality: Left;  150   KNEE ARTHROSCOPY WITH PATELLA RECONSTRUCTION Left 1995   PENILE PROSTHESIS IMPLANT  2022   UMBILICAL HERNIA REPAIR  2000   x 2    Current Outpatient Medications  Medication Sig Dispense Refill   allopurinol  (ZYLOPRIM ) 100 MG tablet Take 100 mg by mouth daily.     amLODipine  (NORVASC ) 10 MG tablet Take 10 mg by mouth daily.     apixaban  (ELIQUIS ) 5 MG TABS tablet Take 2.5 mg by mouth 2 (two) times daily.     Carboxymethylcellulose Sodium 0.25 % SOLN Place 1 drop into both eyes as needed (dry eyes).     colchicine  0.6 MG tablet  Take 0.6 mg by mouth daily as needed (gout).     empagliflozin (JARDIANCE) 25 MG TABS tablet Take 12.5 mg by mouth daily.     finasteride  (PROSCAR ) 5 MG tablet Take 5 mg by mouth daily.     hydrALAZINE  (APRESOLINE ) 50 MG tablet Take 50 mg by mouth 2 (two) times daily.     insulin  glargine (LANTUS) 100 UNIT/ML injection Inject 20 Units into the skin at bedtime. (Patient taking differently: Inject 15 Units into the skin at bedtime.)     ketoconazole (NIZORAL) 2 % cream Apply 1 Application topically daily.     lisinopril (ZESTRIL) 40 MG tablet Take 40 mg by mouth daily.     metFORMIN (GLUCOPHAGE) 500 MG tablet Take 500 mg by mouth 2 (two) times daily with a meal.     metoprolol  tartrate (LOPRESSOR ) 50 MG tablet Take 25 mg by mouth 2 (two) times daily.     omeprazole (PRILOSEC) 20 MG capsule  Take 20 mg by mouth daily.     Semaglutide,0.25 or 0.5MG /DOS, 2 MG/1.5ML SOPN Inject 0.5 mg into the skin every Wednesday.     tamsulosin  (FLOMAX ) 0.4 MG CAPS capsule Take 0.4 mg by mouth daily.     trospium (SANCTURA) 20 MG tablet Take 20 mg by mouth 2 (two) times daily.     Current Facility-Administered Medications  Medication Dose Route Frequency Provider Last Rate Last Admin   betamethasone  acetate-betamethasone  sodium phosphate  (CELESTONE ) injection 12 mg  12 mg Other Once Price, Michael J, DPM        Allergies as of 11/09/2023 - Review Complete 11/09/2023  Allergen Reaction Noted   Penicillin g Nausea And Vomiting 06/20/2017   Pravastatin Nausea And Vomiting 06/20/2017   Niacin Rash 06/20/2017    Family History  Problem Relation Age of Onset   Other Mother        Organs failed   Brain cancer Father     Review of Systems:    Constitutional: No weight loss, fever, chills, weakness or fatigue HEENT: Eyes: No change in vision               Ears, Nose, Throat:  No change in hearing or congestion Skin: No rash or itching Cardiovascular: No chest pain, chest pressure or palpitations    Respiratory: No SOB or cough Gastrointestinal: See HPI and otherwise negative Genitourinary: No dysuria or change in urinary frequency Neurological: No headache, dizziness or syncope Musculoskeletal: No new muscle or joint pain Hematologic: No bleeding or bruising Psychiatric: No history of depression or anxiety    Physical Exam:  Vital signs: BP 130/70   Pulse 85   Ht 5' 11.5 (1.816 m)   Wt 201 lb (91.2 kg)   BMI 27.64 kg/m   Constitutional:   Pleasant male appears to be in NAD, Well developed, Well nourished, alert and cooperative  Throat: Oral cavity and pharynx without inflammation, swelling or lesion.  Respiratory: Respirations even and unlabored. Lungs clear to auscultation bilaterally.   No wheezes, crackles, or rhonchi.  Cardiovascular: Normal S1, S2. Regular rate and rhythm. No peripheral edema, cyanosis or pallor.  Gastrointestinal:  Soft, nondistended, nontender. No rebound or guarding. Normal bowel sounds. No appreciable masses or hepatomegaly. Rectal:  Not performed.  Msk:  Symmetrical without gross deformities. Without edema, no deformity or joint abnormality.  Neurologic:  Alert and  oriented x4;  grossly normal neurologically.  Skin:   Dry and intact without significant lesions or rashes.  RELEVANT LABS AND IMAGING: CBC    Latest Ref Rng & Units 10/14/2021    3:48 AM 10/01/2021   11:12 AM  CBC  WBC 4.0 - 10.5 K/uL 6.7  4.4   Hemoglobin 13.0 - 17.0 g/dL 86.6  84.5   Hematocrit 39.0 - 52.0 % 42.1  49.2   Platelets 150 - 400 K/uL 122  153      CMP     Latest Ref Rng & Units 10/14/2021    3:48 AM 10/01/2021   11:12 AM  CMP  Glucose 70 - 99 mg/dL 811  852   BUN 8 - 23 mg/dL 27  19   Creatinine 9.38 - 1.24 mg/dL 8.45  8.47   Sodium 864 - 145 mmol/L 137  140   Potassium 3.5 - 5.1 mmol/L 4.8  4.5   Chloride 98 - 111 mmol/L 107  107   CO2 22 - 32 mmol/L 26  30   Calcium 8.9 - 10.3 mg/dL 8.7  9.4  10/06/23 CTAP IMPRESSION:   1. Partially imaged penile  prosthesis with an inflated reservoir in the right anterior pelvis and a decompressed reservoir in the left anterior pelvis. This Robertine Kipper be contributing to the patient's sensation of right pelvic fullness.  2.  Mild bladder wall thickening with mild surrounding inflammatory stranding. Correlation with urinalysis is recommended to exclude a urinary tract infection.  3.  Normal caliber of the appendix without surrounding inflammatory changes.  4.  Multiple additional ancillary findings as above.   09/20/23 liver elastography IMPRESSION: ULTRASOUND LIVER:   Hepatic steatosis.   ULTRASOUND HEPATIC ELASTOGRAPHY:   Median kPa:  1.5   Diagnostic category:  < or = 5 kPa: high probability of being normal   In the setting of elevated liver function tests, non-fasting state, or vascular congestion, the stage of liver fibrosis Blayke Pinera be overestimated. In some patients with NAFLD, the cut-off values for cACLD Adden Strout be lower (7-9 kPa). In causes other than viral hepatitis and NAFLD, the cut-off values are not well established.   Assessment: Encounter Diagnoses  Name Primary?   Bloating Yes   Altered bowel habits      83 year old male patient that presents with altered bowel habits and gas and bloat.  Patient denies any recent travel, exposure or medication changes.  No known triggers.  Will go ahead and order lab work to rule out inflammatory disease or celiac.  Will also check stool test to rule out enteric infection.  Patient also mentions odorous belching will go ahead and do Diatherix H. pylori test.  Will order abdominal x-ray to rule out stool burden.  Patient has had a total of 2-3 colonoscopies that were all normal.  Last colonoscopy over 10 years ago.  Patient inquires about having another colonoscopy due to current symptoms.  Will defer to Dr. Charlanne  History of Atrial fibrillation       -on Eliquis   Plan: -abdominal xray 2 view -Check CRP, TTG IgA, IgA, TSH  -GI profile stool, cdiff PCR - H  pylori diathereix stool test -patient requesting colonoscopy , defer to Dr. Charlanne  Thank you for the courtesy of this consult. Please call me with any questions or concerns.   Aerionna Moravek, FNP-C [facility] 11/09/2023, 5:03 PM  Cc: Administration, [employer]

## 2023-11-10 ENCOUNTER — Other Ambulatory Visit

## 2023-11-10 DIAGNOSIS — R194 Change in bowel habit: Secondary | ICD-10-CM

## 2023-11-10 DIAGNOSIS — R140 Abdominal distension (gaseous): Secondary | ICD-10-CM

## 2023-11-10 LAB — IGA: Immunoglobulin A: 301 mg/dL (ref 70–320)

## 2023-11-10 LAB — TISSUE TRANSGLUTAMINASE ABS,IGG,IGA
(tTG) Ab, IgA: <1 U/mL
(tTG) Ab, IgG: 2.4 U/mL

## 2023-11-12 LAB — GI PROFILE, STOOL, PCR

## 2023-11-13 ENCOUNTER — Ambulatory Visit: Payer: Self-pay | Admitting: Gastroenterology

## 2023-11-13 ENCOUNTER — Telehealth: Payer: Self-pay

## 2023-11-13 NOTE — Telephone Encounter (Signed)
 See telephone encounter.

## 2024-03-27 ENCOUNTER — Encounter (HOSPITAL_COMMUNITY)

## 2024-03-27 ENCOUNTER — Ambulatory Visit

## 2024-04-03 ENCOUNTER — Encounter (HOSPITAL_COMMUNITY)

## 2024-04-03 ENCOUNTER — Ambulatory Visit
